# Patient Record
Sex: Female | Born: 1950 | Race: White | Hispanic: No | Smoking: Never smoker
Health system: Southern US, Community
[De-identification: ages and names within clinical notes are randomized; demographics above are authoritative.]

## PROBLEM LIST (undated history)

## (undated) DIAGNOSIS — E039 Hypothyroidism, unspecified: Secondary | ICD-10-CM

## (undated) DIAGNOSIS — Z889 Allergy status to unspecified drugs, medicaments and biological substances status: Secondary | ICD-10-CM

## (undated) DIAGNOSIS — F32A Depression, unspecified: Secondary | ICD-10-CM

## (undated) DIAGNOSIS — M1990 Unspecified osteoarthritis, unspecified site: Secondary | ICD-10-CM

## (undated) DIAGNOSIS — K648 Other hemorrhoids: Secondary | ICD-10-CM

## (undated) DIAGNOSIS — F329 Major depressive disorder, single episode, unspecified: Secondary | ICD-10-CM

## (undated) DIAGNOSIS — R112 Nausea with vomiting, unspecified: Secondary | ICD-10-CM

## (undated) DIAGNOSIS — IMO0001 Reserved for inherently not codable concepts without codable children: Secondary | ICD-10-CM

## (undated) DIAGNOSIS — R350 Frequency of micturition: Secondary | ICD-10-CM

## (undated) DIAGNOSIS — I10 Essential (primary) hypertension: Secondary | ICD-10-CM

## (undated) DIAGNOSIS — K219 Gastro-esophageal reflux disease without esophagitis: Secondary | ICD-10-CM

## (undated) DIAGNOSIS — Z9889 Other specified postprocedural states: Secondary | ICD-10-CM

## (undated) DIAGNOSIS — E785 Hyperlipidemia, unspecified: Secondary | ICD-10-CM

## (undated) HISTORY — PX: DILATION AND CURETTAGE OF UTERUS: SHX78

## (undated) HISTORY — DX: Major depressive disorder, single episode, unspecified: F32.9

## (undated) HISTORY — DX: Unspecified osteoarthritis, unspecified site: M19.90

## (undated) HISTORY — DX: Hyperlipidemia, unspecified: E78.5

## (undated) HISTORY — PX: OVARIAN CYST REMOVAL: SHX89

## (undated) HISTORY — PX: BLADDER SURGERY: SHX569

## (undated) HISTORY — DX: Other hemorrhoids: K64.8

## (undated) HISTORY — PX: PARTIAL HYSTERECTOMY: SHX80

## (undated) HISTORY — PX: TONSILLECTOMY: SUR1361

## (undated) HISTORY — PX: JOINT REPLACEMENT: SHX530

## (undated) HISTORY — DX: Allergy status to unspecified drugs, medicaments and biological substances: Z88.9

## (undated) HISTORY — DX: Hypothyroidism, unspecified: E03.9

## (undated) HISTORY — PX: OTHER SURGICAL HISTORY: SHX169

## (undated) HISTORY — DX: Depression, unspecified: F32.A

## (undated) HISTORY — PX: CATARACT EXTRACTION: SUR2

## (undated) HISTORY — DX: Gastro-esophageal reflux disease without esophagitis: K21.9

## (undated) HISTORY — PX: LIPOMA EXCISION: SHX5283

## (undated) HISTORY — PX: CARPAL TUNNEL RELEASE: SHX101

## (undated) HISTORY — DX: Reserved for inherently not codable concepts without codable children: IMO0001

---

## 2002-10-22 ENCOUNTER — Emergency Department (HOSPITAL_COMMUNITY): Admission: EM | Admit: 2002-10-22 | Discharge: 2002-10-22 | Payer: Self-pay | Admitting: *Deleted

## 2004-07-24 ENCOUNTER — Encounter: Admission: RE | Admit: 2004-07-24 | Discharge: 2004-07-24 | Payer: Self-pay | Admitting: Family Medicine

## 2006-02-13 ENCOUNTER — Ambulatory Visit (HOSPITAL_COMMUNITY): Admission: RE | Admit: 2006-02-13 | Discharge: 2006-02-13 | Payer: Self-pay | Admitting: Family Medicine

## 2006-02-18 ENCOUNTER — Ambulatory Visit (HOSPITAL_COMMUNITY): Admission: RE | Admit: 2006-02-18 | Discharge: 2006-02-18 | Payer: Self-pay | Admitting: Family Medicine

## 2007-09-17 ENCOUNTER — Ambulatory Visit (HOSPITAL_COMMUNITY): Admission: RE | Admit: 2007-09-17 | Discharge: 2007-09-17 | Payer: Self-pay | Admitting: Family Medicine

## 2007-10-08 ENCOUNTER — Encounter: Admission: RE | Admit: 2007-10-08 | Discharge: 2007-10-08 | Payer: Self-pay | Admitting: Family Medicine

## 2008-03-08 ENCOUNTER — Ambulatory Visit (HOSPITAL_COMMUNITY): Admission: RE | Admit: 2008-03-08 | Discharge: 2008-03-08 | Payer: Self-pay | Admitting: Family Medicine

## 2008-03-31 ENCOUNTER — Ambulatory Visit: Payer: Self-pay | Admitting: Gastroenterology

## 2008-04-06 ENCOUNTER — Ambulatory Visit: Payer: Self-pay | Admitting: Gastroenterology

## 2008-04-06 ENCOUNTER — Ambulatory Visit (HOSPITAL_COMMUNITY): Admission: RE | Admit: 2008-04-06 | Discharge: 2008-04-06 | Payer: Self-pay | Admitting: Gastroenterology

## 2008-04-06 ENCOUNTER — Encounter: Payer: Self-pay | Admitting: Gastroenterology

## 2008-04-06 HISTORY — PX: ESOPHAGOGASTRODUODENOSCOPY: SHX1529

## 2008-04-06 HISTORY — PX: OTHER SURGICAL HISTORY: SHX169

## 2008-05-24 ENCOUNTER — Ambulatory Visit: Payer: Self-pay | Admitting: Gastroenterology

## 2009-02-20 ENCOUNTER — Inpatient Hospital Stay (HOSPITAL_COMMUNITY): Admission: EM | Admit: 2009-02-20 | Discharge: 2009-02-22 | Payer: Self-pay | Admitting: Emergency Medicine

## 2009-05-30 ENCOUNTER — Ambulatory Visit (HOSPITAL_COMMUNITY): Admission: RE | Admit: 2009-05-30 | Discharge: 2009-05-30 | Payer: Self-pay | Admitting: Family Medicine

## 2009-09-07 ENCOUNTER — Encounter: Admission: RE | Admit: 2009-09-07 | Discharge: 2009-09-07 | Payer: Self-pay | Admitting: Family Medicine

## 2009-09-21 ENCOUNTER — Ambulatory Visit (HOSPITAL_COMMUNITY): Admission: RE | Admit: 2009-09-21 | Discharge: 2009-09-21 | Payer: Self-pay | Admitting: Family Medicine

## 2009-10-17 ENCOUNTER — Ambulatory Visit (HOSPITAL_COMMUNITY): Admission: RE | Admit: 2009-10-17 | Discharge: 2009-10-17 | Payer: Self-pay | Admitting: Family Medicine

## 2009-12-17 ENCOUNTER — Ambulatory Visit (HOSPITAL_COMMUNITY): Admission: RE | Admit: 2009-12-17 | Discharge: 2009-12-17 | Payer: Self-pay | Admitting: Family Medicine

## 2010-05-20 ENCOUNTER — Ambulatory Visit: Payer: Self-pay | Admitting: Internal Medicine

## 2010-05-20 ENCOUNTER — Inpatient Hospital Stay (HOSPITAL_COMMUNITY): Admission: EM | Admit: 2010-05-20 | Discharge: 2010-05-24 | Payer: Self-pay | Admitting: Emergency Medicine

## 2010-05-22 ENCOUNTER — Ambulatory Visit: Payer: Self-pay | Admitting: Internal Medicine

## 2010-06-03 ENCOUNTER — Encounter: Payer: Self-pay | Admitting: Internal Medicine

## 2010-08-22 ENCOUNTER — Ambulatory Visit: Payer: Self-pay | Admitting: Cardiology

## 2010-08-22 DIAGNOSIS — E039 Hypothyroidism, unspecified: Secondary | ICD-10-CM | POA: Insufficient documentation

## 2010-08-22 DIAGNOSIS — K5730 Diverticulosis of large intestine without perforation or abscess without bleeding: Secondary | ICD-10-CM | POA: Insufficient documentation

## 2010-08-22 DIAGNOSIS — I10 Essential (primary) hypertension: Secondary | ICD-10-CM | POA: Insufficient documentation

## 2010-08-22 DIAGNOSIS — E785 Hyperlipidemia, unspecified: Secondary | ICD-10-CM | POA: Insufficient documentation

## 2010-09-27 ENCOUNTER — Encounter: Admission: RE | Admit: 2010-09-27 | Discharge: 2010-09-27 | Payer: Self-pay | Admitting: Family Medicine

## 2010-10-01 ENCOUNTER — Encounter: Payer: Self-pay | Admitting: Cardiology

## 2010-10-09 ENCOUNTER — Encounter: Admission: RE | Admit: 2010-10-09 | Discharge: 2010-10-09 | Payer: Self-pay | Admitting: Family Medicine

## 2010-10-14 ENCOUNTER — Encounter: Payer: Self-pay | Admitting: Cardiology

## 2010-10-14 ENCOUNTER — Encounter (INDEPENDENT_AMBULATORY_CARE_PROVIDER_SITE_OTHER): Payer: Self-pay | Admitting: *Deleted

## 2010-10-14 LAB — CONVERTED CEMR LAB
ALT: 18 units/L
AST: 21 units/L
Albumin: 4.6 g/dL
Alkaline Phosphatase: 33 units/L
Total Protein: 6.5 g/dL

## 2010-10-16 LAB — CONVERTED CEMR LAB
ALT: 18 units/L (ref 0–35)
AST: 21 units/L (ref 0–37)
Alkaline Phosphatase: 33 units/L — ABNORMAL LOW (ref 39–117)
Cholesterol: 112 mg/dL (ref 0–200)
Indirect Bilirubin: 0.2 mg/dL (ref 0.0–0.9)
Total Protein: 6.5 g/dL (ref 6.0–8.3)
Triglycerides: 254 mg/dL — ABNORMAL HIGH (ref <150)
VLDL: 51 mg/dL — ABNORMAL HIGH (ref 0–40)

## 2010-11-07 ENCOUNTER — Encounter (INDEPENDENT_AMBULATORY_CARE_PROVIDER_SITE_OTHER): Payer: Self-pay | Admitting: *Deleted

## 2010-11-11 ENCOUNTER — Encounter (INDEPENDENT_AMBULATORY_CARE_PROVIDER_SITE_OTHER): Payer: Self-pay | Admitting: *Deleted

## 2010-11-11 ENCOUNTER — Ambulatory Visit: Payer: Self-pay | Admitting: Cardiology

## 2010-11-12 ENCOUNTER — Encounter (INDEPENDENT_AMBULATORY_CARE_PROVIDER_SITE_OTHER): Payer: Self-pay | Admitting: *Deleted

## 2010-11-19 ENCOUNTER — Encounter: Payer: Self-pay | Admitting: Cardiology

## 2010-11-20 ENCOUNTER — Encounter: Payer: Self-pay | Admitting: Adult Health

## 2011-01-08 ENCOUNTER — Ambulatory Visit (HOSPITAL_COMMUNITY): Admission: RE | Admit: 2011-01-08 | Payer: Self-pay | Source: Home / Self Care | Admitting: Cardiology

## 2011-01-13 ENCOUNTER — Ambulatory Visit: Admit: 2011-01-13 | Payer: Self-pay | Admitting: Cardiology

## 2011-01-19 ENCOUNTER — Encounter: Payer: Self-pay | Admitting: Family Medicine

## 2011-01-30 NOTE — Assessment & Plan Note (Signed)
Summary: 3 mth f/u /tg   Visit Type:  Follow-up Primary Kyona Chauncey:  [name]   History of Present Illness: [name] is a very nice 60 y/o CF with known history of IDDM (now on a pump and a sensor subq), hypertension, hypertriglyceridemia.  She is here on follow-up for triglyceridemia.  She was placed on fenofibrate by Dr. Daleen Squibb on last visit, a follow-up lipid study was completed.  She has since been seen by Dr. Rocky Crafts who is an endocrinologist.  He has adjusted her insulin pump and added Crestor to her medication regimine along with fenobriate previously started in August of 2011.  Follow-up labs done on the 11/05/2010 per Dr. Rocky Crafts reveals triglycerides of 135, Cholesterol of 100 and normal LFT's.  She wishes one physician to follow her cholesterol and manage it.  She has multiple CVRF but is asymptomatic.  She says that once her blood glucose was under control she feels "20 years younger."  Current Medications (verified): 1)  Metformin Hcl 1000 Mg Tabs (Metformin Hcl) .... Take 1 Tab Two Times A Day 2)  Fenofibrate 160 Mg Tabs (Fenofibrate) .... Take 1 Tablet By Mouth At Bedtime 3)  Fish Oil 1200 Mg Caps (Omega-3 Fatty Acids) .... Take 2 Caps Two Times A Day 4)  Daily Multi  Tabs (Multiple Vitamins-Minerals) .... Take 1 Tab Daily 5)  Verapamil Hcl 120 Mg Tabs (Verapamil Hcl) .... Take 1 Tab Two Times A Day 6)  Lisinopril 40 Mg Tabs (Lisinopril) .... Take 1 Tab Daily 7)  Levothroid 50 Mcg Tabs (Levothyroxine Sodium) .... Take 1 Tab Daily 8)  Vitamin C Cr 500 Mg Cr-Caps (Ascorbic Acid) .... Take 1 Tab Daily 9)  Vitamin D 400 Unit Caps (Cholecalciferol) .... Take 1 Tab Daily 10)  Co Q 10 60 Mg Caps (Coenzyme Q10) .... Take 1 Tab Daily 11)  B Complex Vitamins  Caps (B Complex Vitamins) .... Take 1 Tab Daily 12)  Flax Seed Oil 1000 Mg Caps (Flaxseed (Linseed)) .... Take 1 Tab Two Times A Day 13)  Mag-Ox 400 400 Mg Tabs (Magnesium Oxide) .... Take 1 Tab Daily 14)   Hydrocodone-Acetaminophen 2.5-500 Mg Tabs (Hydrocodone-Acetaminophen) .... Take As Needed 15)  Citalopram Hydrobromide 40 Mg Tabs (Citalopram Hydrobromide) .... Take 1 Tab Daily  Allergies: 1)  ! * Demorol 2)  ! Penicillin  Comments:  Nurse/Medical Assistant: patient stated she takes 2 caps of fish oil two times a day and1 flaxseed two times a day and citlatapram 1 tab daily   Past History:  Past medical, surgical, family and social histories (including risk factors) reviewed, and no changes noted (except as noted below).  Past Medical History: Reviewed history from 08/20/2010 and no changes required. acute diverticulitis allergic reaction to augmentin osteoarthritis gerds hyperlipidemia depression hypothyroidism ulcer at the gastroesophageal junction  Past Surgical History: Reviewed history from 08/20/2010 and no changes required. hysterectomy (partial) bladder tack surgery bilateral carpal tunnel release bilateral arm surgery removal of benign lipomas from the abdoman post tonsillectomy goiter removals  Family History: Reviewed history from 08/22/2010 and no changes required. Family History of Coronary Artery Disease:  Family History of Diabetes:  Family History of Hypertension:   Social History: Reviewed history from 08/22/2010 and no changes required.  Widowed  Retired  Tobacco Use - No.  Alcohol Use - no  Review of Systems       All other systems have been reviewed and are negative unless stated above.   Vital Signs:  Patient profile:  60 year old female Weight:      106 pounds O2 Sat:      95 % on Room air Pulse rate:   95 / minute BP sitting:   122 / 75  (right arm)  Vitals Entered By: Dreama Saa, CNA (November 11, 2010 10:48 AM)  O2 Flow:  Room air  Physical Exam  General:  Well developed, well nourished, in no acute distress.healthy appearing.   Head:  normocephalic and atraumatic Eyes:  Glasses Lungs:  Clear bilaterally to  auscultation and percussion. Heart:  Non-displaced PMI, chest non-tender; regular rate and rhythm, S1, S2 without murmurs, rubs or gallops. Carotid upstroke normal, no bruit. Normal abdominal aortic size, no bruits. Femorals normal pulses, no bruits. Pedals normal pulses. No edema, no varicosities. Abdomen:  Bowel sounds positive; abdomen soft and non-tender without masses, organomegaly, or hernias noted. No hepatosplenomegaly. She has an insulin pump on the Right and blood glucose sensor on the left abdomen. Msk:  Back normal, normal gait. Muscle strength and tone normal. Pulses:  pulses normal in all 4 extremities Extremities:  No clubbing or cyanosis. Neurologic:  Alert and oriented x 3. Psych:  Normal affect.   Impression & Recommendations:  Problem # 1:  HYPERLIPIDEMIA (ICD-272.4) She has had a vast improvement in her triglyceride status with fenofibrate.  She is recently started on crestor 10mg  per endocrinologist.  He is following her closely and she wishes that he continue to manage this diagnosis.  We will make no changes at this time and defer to Dr. Rocky Crafts as her request. Her updated medication list for this problem includes:    Fenofibrate 160 Mg Tabs (Fenofibrate) .Marland Kitchen... Take 1 tablet by mouth at bedtime  Problem # 2:  CORONARY ARTERY DISEASE, FAMILY HX (ICD-V17.3) She has multiple CVRF for CAD-Hypertension, DM,Mixed hyperlipidemia, and strong family history.  I will plan a stress echo for her to evaluate for underlying ischemia.  She is asymptomatic at present and wishes to wait until January 2012 to proceed with this as she is in the [place] a few weeks.  Will plan for stress echo and follow-up in January.  If she becomes symptomatic will see her sooner. Orders: Stress Echo (Stress Echo)  Problem # 3:  HYPERTENSION, MILD (ICD-401.1) Assessment: Unchanged Well controlled. Her updated medication list for this problem includes:     Verapamil Hcl 120 Mg Tabs (Verapamil hcl) .Marland Kitchen... Take 1 tab two times a day    Lisinopril 40 Mg Tabs (Lisinopril) .Marland Kitchen... Take 1 tab daily  Orders: Stress Echo (Stress Echo)  Patient Instructions: 1)  Your physician recommends that you schedule a follow-up appointment in: after testing 2)  Your physician has requested that you have a stress echocardiogram. For further information please visit [email or web address]  Please follow instruction sheet as given.

## 2011-01-30 NOTE — Letter (Signed)
Summary: LABS 07/17/10  LABS 07/17/10   Imported By: Faythe Ghee 08/22/2010 11:49:39  _____________________________________________________________________  External Attachment:    Type:   Image     Comment:   External Document

## 2011-01-30 NOTE — Letter (Signed)
Summary: [facility] PROGRESS NOTE 07/17/10  [facility]GRESS NOTE 07/17/10   Imported By: Faythe Ghee 08/22/2010 11:50:04  _____________________________________________________________________  External Attachment:    Type:   Image     Comment:   External Document

## 2011-01-30 NOTE — Letter (Signed)
Summary: labs 09/27/10  labs 09/27/10   Imported By: Faythe Ghee 10/01/2010 15:44:16  _____________________________________________________________________  External Attachment:    Type:   Image     Comment:   External Document

## 2011-01-30 NOTE — Assessment & Plan Note (Signed)
Summary: **per Dr.Golding for elevated Lipids/pt. requests Dr.Wall/tg   Visit Type:  Initial Consult Primary [name]:  [name]  CC:  no cardiology complaints.  History of Present Illness: [name] is a 60 y/o CF we are seeing at the kind request of Dr. Phillips Odor. She has a history of IDDM (on a pump for 2 months), Hypertension, Hypertriglyceridemia, Hypothyroidism and Diverticulitis.  We are asked for recommendations on mixed hyperlipidemia as her cholesterol and triglycerides are elevated (Triglyercides 948).  She had been intolerant to niacin and higher doses of statins.  She has been seen by cardiology in early 2000 in [place] which included a stress test and echo-pt reports this was normal and she did not follow-up with cardiology after that.  She is asymtpomtic.  Preventive Screening-Counseling & Management  Alcohol-Tobacco     Alcohol drinks/day: 0     Smoking Status: never  Current Medications (verified): 1)  Metformin Hcl 1000 Mg Tabs (Metformin Hcl) .... Take 1 Tab Two Times A Day 2)  Fenofibrate 160 Mg Tabs (Fenofibrate) .... Take 1 Tablet By Mouth At Bedtime 3)  Fish Oil 1200 Mg Caps (Omega-3 Fatty Acids) .... Take 1 Cap Daily 4)  Daily Multi  Tabs (Multiple Vitamins-Minerals) .... Take 1 Tab Daily 5)  Verapamil Hcl 120 Mg Tabs (Verapamil Hcl) .... Take 1 Tab Two Times A Day 6)  Lisinopril 40 Mg Tabs (Lisinopril) .... Take 1 Tab Daily 7)  Levothroid 50 Mcg Tabs (Levothyroxine Sodium) .... Take 1 Tab Daily 8)  Vitamin C Cr 500 Mg Cr-Caps (Ascorbic Acid) .... Take 1 Tab Daily 9)  Vitamin D 400 Unit Caps (Cholecalciferol) .... Take 1 Tab Daily 10)  Co Q 10 60 Mg Caps (Coenzyme Q10) .... Take 1 Tab Daily 11)  B Complex Vitamins  Caps (B Complex Vitamins) .... Take 1 Tab Daily 12)  Flax Seed Oil 1000 Mg Caps (Flaxseed (Linseed)) .... Take 1 Tab Daily 13)  Mag-Ox 400 400 Mg Tabs (Magnesium Oxide) .... Take 1 Tab Daily 14)  Hydrocodone-Acetaminophen 2.5-500 Mg Tabs  (Hydrocodone-Acetaminophen) .... Take As Needed 15)  Citalopram Hydrobromide 40 Mg Tabs (Citalopram Hydrobromide) .... Take 1/2 Tab Daily  Allergies (verified): 1)  ! * Demorol 2)  ! Penicillin  Family History: Family History of Coronary Artery Disease:  Family History of Diabetes:  Family History of Hypertension:   Social History:  Widowed  Retired  Tobacco Use - No.  Alcohol Use - no Alcohol drinks/day:  0 Smoking Status:  never  Review of Systems       All other systems have been reviewed and are negative unless stated above.   Vital Signs:  Patient profile:   60 year old female Height:      58 inches Weight:      103 pounds BMI:     21.60 Pulse rate:   81 / minute BP sitting:   128 / 79  (right arm)  Vitals Entered By: Dreama Saa, CNA (August 22, 2010 9:50 AM)  Physical Exam  General:  Well developed, well nourished, in no acute distress. Head:  normocephalic and atraumatic Eyes:  Wears glasses Lungs:  Clear bilaterally to auscultation and percussion. Heart:  Non-displaced PMI, chest non-tender; regular rate and rhythm, S1, S2 without murmurs, rubs or gallops. Carotid upstroke normal, no bruit. Normal abdominal aortic size, no bruits. Femorals normal pulses, no bruits. Pedals normal pulses. No edema, no varicosities. Abdomen:  Bowel sounds positive; abdomen soft and non-tender without masses, organomegaly, or hernias  noted. No hepatosplenomegaly. Msk:  Back normal, normal gait. Muscle strength and tone normal. Pulses:  pulses normal in all 4 extremities Extremities:  trace left pedal edema.  trace left pedal edema.   Neurologic:  Alert and oriented x 3. Skin:  Intact without lesions or rashes. Psych:  Normal affect.   Impression & Recommendations:  Problem # 1:  HYPERLIPIDEMIA (ICD-272.4) After careful review of medications and discussion with patient, it has been advised by Dr. Daleen Squibb that the patient stop Simvistatin in the setting of Verapamil use.   Also hypertriglyceridemia is severely elevated >900.  Will begin fenofibrate 160mg  daily.  Will control hypertriglyeridemia first and try to get it below 500. At that point will add back statin, probably Pravachol as it is better tolerated.  Will check fasting lipids in 6-8 weeks. There is a drug-drug interaction with use of statins and fenofibrates.  Pravastatin would be the best choice at this time.  Will see her back in 3 months to evaluate status. The following medications were removed from the medication list:    Niacin Cr 500 Mg Cr-caps (Niacin) .Marland Kitchen... Take 1 tab daily Her updated medication list for this problem includes:    Fenofibrate 160 Mg Tabs (Fenofibrate) .Marland Kitchen... Take 1 tablet by mouth at bedtime  Future Orders: T-Lipid Profile ([id number]) ... 10/14/2010 T-Hepatic Function [phone]) ... 10/14/2010  Problem # 2:  CORONARY ARTERY DISEASE, FAMILY HX (ICD-V17.3) She has significant risk factors for CAD.  Once mixed hyperlipidemia is better controlled would plan cardiac work-up with stress test.  Patient Instructions: 1)  Your physician recommends that you schedule a follow-up appointment in: 3 2)  Your physician recommends that you return for lab work in: 6 to 8 weeks 3)  Your physician has recommended you make the following change in your medication: stop taking Simvastatin and start taking Fenofibrate 160mg  by mouth once daily  Prescriptions: FENOFIBRATE 160 MG TABS (FENOFIBRATE) take 1 tablet by mouth at bedtime  #30 x 3   Entered by:   Larita Fife Via LPN   Authorized by:   Gaylord Shih, MD, [facility]   Signed by:   Larita Fife Via LPN on 53/66/4403   Method used:   Electronically to        [facility]  [address] (retail)       [address]       [place], [place]  [address]       Ph: [phone]       Fax: [phone]   RxID:   [id number]

## 2011-01-30 NOTE — Miscellaneous (Signed)
  Clinical Lists Changes  Medications: Added new medication of CRESTOR 10 MG TABS (ROSUVASTATIN CALCIUM) Take one tablet by mouth daily. 

## 2011-01-30 NOTE — Miscellaneous (Signed)
Summary: labs lipids,liver,10/14/2010  Clinical Lists Changes  Observations: Added new observation of ALBUMIN: 4.6 g/dL (16/09/9603 54:09) Added new observation of PROTEIN, TOT: 6.5 g/dL (81/19/1478 29:56) Added new observation of SGPT (ALT): 18 units/L (10/14/2010 10:59) Added new observation of SGOT (AST): 21 units/L (10/14/2010 10:59) Added new observation of ALK PHOS: 33 units/L (10/14/2010 10:59) Added new observation of BILI DIRECT: 0.1 mg/dL (21/30/8657 84:69) Added new observation of LDL: 28 mg/dL (62/95/2841 32:44) Added new observation of HDL: 33 mg/dL (12/31/7251 66:44) Added new observation of TRIGLYC TOT: 254 mg/dL (03/47/4259 56:38) Added new observation of CHOLESTEROL: 112 mg/dL (75/64/3329 51:88)

## 2011-01-30 NOTE — Letter (Signed)
Summary: Stress Echocardiogram Information Sheet  [facility]  [address] [place] [address]   Phone: [phone]  Fax: [phone]      November 11, 2010 MRN: [id number] light prior to the test.   [name]  Doctor: Appointment Date: Appointment Time: Appointment Location: [facility]  Stress Echocardiogram Information Sheet    Instructions:   1. DO NOT  take your METFORMIN,VERAPAMIL,LISINOPRI medicine THE MORNING OF THE TEST.  2. NOTHING TO EAT OR DRINK AFTER MIDNIGHT  3. Dress prepared to exercise.  4. DO NOT use ANY caffine or tobacco products 3 hours before appointment.  5. Report to the Short Stay Center on the1st floor.  6. Please bring all current prescription medications.  7. If you have any questions, please call [phone]

## 2011-01-30 NOTE — Letter (Signed)
Summary: DR Leslie Dales OFFICE RECORDS  [facility] OFFICE RECORDS   Imported By: Faythe Ghee 11/19/2010 10:29:53  _____________________________________________________________________  External Attachment:    Type:   Image     Comment:   External Document

## 2011-01-30 NOTE — Letter (Signed)
Summary: LABS  LABS   Imported By: Faythe Ghee 11/20/2010 13:04:54  _____________________________________________________________________  External Attachment:    Type:   Image     Comment:   External Document

## 2011-01-30 NOTE — Letter (Signed)
Summary: [facility]  [address] [place] [address]   Phone: [phone]  Fax: [phone]     August 22, 2010 MRN: [id number]   [facility] [address] [place], [place]  [address]      YOUR LAB WORK IS DUE  October 14, 2010 _________________________________________  Please go to [facility], located across the street from [facility] on the second floor.  Hours are Monday - Friday 7am until 7:30pm         Saturday 8am until 12noon    _X_  DO NOT EAT OR DRINK AFTER MIDNIGHT EVENING PRIOR TO LABWORK  __ YOUR LABWORK IS NOT FASTING --YOU MAY EAT PRIOR TO LABWORK

## 2011-01-30 NOTE — Consult Note (Signed)
Summary: Consultation Report  Consultation Report   Imported By: Peggyann Shoals 06/03/2010 10:32:03  _____________________________________________________________________  External Attachment:    Type:   Image     Comment:   External Document

## 2011-01-31 NOTE — Letter (Signed)
Summary: office not from dr altheimer 09/27/10  office not from dr altheimer 09/27/10   Imported By: Faythe Ghee 10/01/2010 15:43:55  _____________________________________________________________________  External Attachment:    Type:   Image     Comment:   External Document

## 2011-03-17 LAB — GLUCOSE, CAPILLARY
Glucose-Capillary: 149 mg/dL — ABNORMAL HIGH (ref 70–99)
Glucose-Capillary: 162 mg/dL — ABNORMAL HIGH (ref 70–99)
Glucose-Capillary: 177 mg/dL — ABNORMAL HIGH (ref 70–99)
Glucose-Capillary: 195 mg/dL — ABNORMAL HIGH (ref 70–99)
Glucose-Capillary: 233 mg/dL — ABNORMAL HIGH (ref 70–99)
Glucose-Capillary: 283 mg/dL — ABNORMAL HIGH (ref 70–99)
Glucose-Capillary: 419 mg/dL — ABNORMAL HIGH (ref 70–99)

## 2011-03-17 LAB — DIFFERENTIAL
Basophils Absolute: 0.1 10*3/uL (ref 0.0–0.1)
Basophils Absolute: 0.1 10*3/uL (ref 0.0–0.1)
Basophils Relative: 0 % (ref 0–1)
Basophils Relative: 0 % (ref 0–1)
Basophils Relative: 1 % (ref 0–1)
Eosinophils Absolute: 0.2 10*3/uL (ref 0.0–0.7)
Eosinophils Relative: 0 % (ref 0–5)
Eosinophils Relative: 1 % (ref 0–5)
Eosinophils Relative: 3 % (ref 0–5)
Lymphocytes Relative: 13 % (ref 12–46)
Lymphocytes Relative: 22 % (ref 12–46)
Lymphs Abs: 2.1 10*3/uL (ref 0.7–4.0)
Monocytes Absolute: 1 10*3/uL (ref 0.1–1.0)
Neutro Abs: 6.1 10*3/uL (ref 1.7–7.7)
Neutrophils Relative %: 66 % (ref 43–77)

## 2011-03-17 LAB — BASIC METABOLIC PANEL
BUN: 16 mg/dL (ref 6–23)
BUN: 8 mg/dL (ref 6–23)
BUN: 9 mg/dL (ref 6–23)
CO2: 27 mEq/L (ref 19–32)
Calcium: 8.4 mg/dL (ref 8.4–10.5)
Calcium: 9.8 mg/dL (ref 8.4–10.5)
Creatinine, Ser: 0.86 mg/dL (ref 0.4–1.2)
Creatinine, Ser: 0.94 mg/dL (ref 0.4–1.2)
GFR calc Af Amer: >60 mL/min (ref >60)
GFR calc non Af Amer: >60 mL/min (ref >60)
GFR calc non Af Amer: >60 mL/min (ref >60)
GFR calc non Af Amer: >60 mL/min (ref >60)
Glucose, Bld: 136 mg/dL — ABNORMAL HIGH (ref 70–99)
Glucose, Bld: 183 mg/dL — ABNORMAL HIGH (ref 70–99)
Potassium: 4.1 mEq/L (ref 3.5–5.1)

## 2011-03-17 LAB — CULTURE, BLOOD (ROUTINE X 2)
Culture: NO GROWTH
Culture: NO GROWTH

## 2011-03-17 LAB — CBC
HCT: 34.4 % — ABNORMAL LOW (ref 36.0–46.0)
HCT: 36.8 % (ref 36.0–46.0)
Hemoglobin: 11.8 g/dL — ABNORMAL LOW (ref 12.0–15.0)
MCHC: 34.2 g/dL (ref 30.0–36.0)
MCHC: 35.5 g/dL (ref 30.0–36.0)
MCV: 86.4 fL (ref 78.0–100.0)
Platelets: 194 10*3/uL (ref 150–400)
Platelets: 224 10*3/uL (ref 150–400)
RBC: 3.66 MIL/uL — ABNORMAL LOW (ref 3.87–5.11)
RBC: 4.02 MIL/uL (ref 3.87–5.11)
RDW: 12.9 % (ref 11.5–15.5)
RDW: 13.2 % (ref 11.5–15.5)
WBC: 9.3 10*3/uL (ref 4.0–10.5)
WBC: 9.9 10*3/uL (ref 4.0–10.5)

## 2011-03-17 LAB — URINALYSIS, ROUTINE W REFLEX MICROSCOPIC
Bilirubin Urine: NEGATIVE
Ketones, ur: NEGATIVE mg/dL
Nitrite: NEGATIVE
Urobilinogen, UA: 0.2 mg/dL (ref 0.0–1.0)

## 2011-03-17 LAB — HEPATIC FUNCTION PANEL
ALT: 26 U/L (ref 0–35)
Bilirubin, Direct: 0.1 mg/dL (ref 0.0–0.3)
Indirect Bilirubin: 0.4 mg/dL (ref 0.3–0.9)
Total Protein: 6.7 g/dL (ref 6.0–8.3)

## 2011-03-17 LAB — LIPASE, BLOOD: Lipase: 27 U/L (ref 11–59)

## 2011-04-15 LAB — CBC
HCT: 38.1 % (ref 36.0–46.0)
MCHC: 34.7 g/dL (ref 30.0–36.0)
MCHC: 34.9 g/dL (ref 30.0–36.0)
MCV: 87.6 fL (ref 78.0–100.0)
Platelets: 267 10*3/uL (ref 150–400)
RBC: 4.35 MIL/uL (ref 3.87–5.11)
RDW: 13.1 % (ref 11.5–15.5)
WBC: 12.1 10*3/uL — ABNORMAL HIGH (ref 4.0–10.5)

## 2011-04-15 LAB — DIFFERENTIAL
Basophils Absolute: 0 10*3/uL (ref 0.0–0.1)
Basophils Relative: 0 % (ref 0–1)
Basophils Relative: 1 % (ref 0–1)
Eosinophils Absolute: 0.1 10*3/uL (ref 0.0–0.7)
Eosinophils Relative: 1 % (ref 0–5)
Lymphocytes Relative: 17 % (ref 12–46)
Lymphs Abs: 2.2 10*3/uL (ref 0.7–4.0)
Monocytes Absolute: 1 10*3/uL (ref 0.1–1.0)
Monocytes Relative: 8 % (ref 3–12)
Neutro Abs: 8.9 10*3/uL — ABNORMAL HIGH (ref 1.7–7.7)
Neutrophils Relative %: 72 % (ref 43–77)
Neutrophils Relative %: 74 % (ref 43–77)

## 2011-04-15 LAB — GLUCOSE, CAPILLARY
Glucose-Capillary: 167 mg/dL — ABNORMAL HIGH (ref 70–99)
Glucose-Capillary: 174 mg/dL — ABNORMAL HIGH (ref 70–99)
Glucose-Capillary: 185 mg/dL — ABNORMAL HIGH (ref 70–99)
Glucose-Capillary: 187 mg/dL — ABNORMAL HIGH (ref 70–99)
Glucose-Capillary: 194 mg/dL — ABNORMAL HIGH (ref 70–99)
Glucose-Capillary: 200 mg/dL — ABNORMAL HIGH (ref 70–99)
Glucose-Capillary: 201 mg/dL — ABNORMAL HIGH (ref 70–99)
Glucose-Capillary: 207 mg/dL — ABNORMAL HIGH (ref 70–99)
Glucose-Capillary: 211 mg/dL — ABNORMAL HIGH (ref 70–99)
Glucose-Capillary: 314 mg/dL — ABNORMAL HIGH (ref 70–99)

## 2011-04-15 LAB — BASIC METABOLIC PANEL
BUN: 15 mg/dL (ref 6–23)
BUN: 18 mg/dL (ref 6–23)
BUN: 25 mg/dL — ABNORMAL HIGH (ref 6–23)
CO2: 24 mEq/L (ref 19–32)
CO2: 25 mEq/L (ref 19–32)
CO2: 27 mEq/L (ref 19–32)
Calcium: 8.4 mg/dL (ref 8.4–10.5)
Calcium: 8.4 mg/dL (ref 8.4–10.5)
Calcium: 8.7 mg/dL (ref 8.4–10.5)
Calcium: 9.8 mg/dL (ref 8.4–10.5)
Chloride: 84 mEq/L — ABNORMAL LOW (ref 96–112)
Chloride: 92 mEq/L — ABNORMAL LOW (ref 96–112)
Chloride: 96 mEq/L (ref 96–112)
Creatinine, Ser: 0.9 mg/dL (ref 0.4–1.2)
Creatinine, Ser: 1 mg/dL (ref 0.4–1.2)
Creatinine, Ser: 1.07 mg/dL (ref 0.4–1.2)
Creatinine, Ser: 1.13 mg/dL (ref 0.4–1.2)
GFR calc Af Amer: 56 mL/min — ABNORMAL LOW (ref >60)
GFR calc Af Amer: 60 mL/min — ABNORMAL LOW (ref >60)
GFR calc Af Amer: >60 mL/min (ref >60)
GFR calc Af Amer: >60 mL/min (ref >60)
GFR calc non Af Amer: 52 mL/min — ABNORMAL LOW (ref >60)
Glucose, Bld: 113 mg/dL — ABNORMAL HIGH (ref 70–99)
Potassium: 4.7 mEq/L (ref 3.5–5.1)
Sodium: 123 mEq/L — ABNORMAL LOW (ref 135–145)
Sodium: 128 mEq/L — ABNORMAL LOW (ref 135–145)

## 2011-04-15 LAB — URINALYSIS, ROUTINE W REFLEX MICROSCOPIC
Nitrite: NEGATIVE
Specific Gravity, Urine: 1.01 (ref 1.005–1.030)
Urobilinogen, UA: 0.2 mg/dL (ref 0.0–1.0)
pH: 6.5 (ref 5.0–8.0)

## 2011-04-15 LAB — PROTIME-INR: INR: 1 (ref 0.00–1.49)

## 2011-05-13 NOTE — Consult Note (Signed)
NAMECALEA, HRIBAR            ACCOUNT NO.:  [id number]   MEDICAL RECORD NO.:  [id number]          PATIENT TYPE:  AMB   LOCATION:  DAY                           FACILITY:  [facility]   PHYSICIAN:  Kassie Mends, M.D.      DATE OF BIRTH:  [birth year]   DATE OF CONSULTATION:  03/31/2008  DATE OF DISCHARGE:                                 CONSULTATION   REQUESTING PHYSICIAN:  Corrie Mckusick, M.D.   REASON FOR CONSULTATION:  Diarrhea, consult for colonoscopy.   HISTORY OF PRESENT ILLNESS:  Ms. Stangelo is a pleasant 60 year old  Caucasian female who presents today for further evaluation of a 6-week  history of vomiting and diarrhea.  She says that she is not vomiting.  She is having diarrhea.  This is occurring daily.  She is having 4-5  soft to loose stools daily.  She has had nocturnal stools.  She feels  like she has a knife in her abdomen.  She complains of a gripey type  pain.  She feels like she is on fire.  She has heartburn when this  occurs.  She denies any dysphagia, odynophagia, melena, or rectal  bleeding, fever or chills.  She was given 2 antibiotics fairly close  together over the past couple of months, one was a Z-Pak and one was  Avelox.  Currently, she is on Lomotil which she is taking 2-4 tablets a  day.  On Lomotil she still has 2-3 loose stools.  She says she did stool  studies which were all negative.  We are trying to get these records.  She says she does feel afraid to eat because of the vomiting and the  diarrhea.  She denies any dysphagia or odynophagia.   CURRENT MEDICATIONS:  1. NovoLog 70/30, 30 units b.i.d.  2. Lantus 60 units every evening.  3. Metformin 1 gram b.i.d.  4. Januvia 100 mg daily.  5. Verapamil 240 mg daily.  6. Lisinopril 40 mg daily.  7. Celexa 10 mg daily.  8. Multivitamin daily.  9. Vitamin C 500 mg daily.  10.Levothyroxine 50 mcg daily.  11.Aspirin 81 mg daily.  12.Lomotil 2-4 daily.  13.Promethazine 25 mg p.r.n.   ALLERGIES:   DEMEROL causes projectile vomiting.   PAST MEDICAL HISTORY:  1. Diabetes mellitus, diagnosed in 1997.  2. Hypertension.  3. Hypercholesterolemia.  4. Depression.  5. Hypothyroidism, status post removal of a goiter when she was a      teenager.   PAST SURGICAL HISTORY:  1. Tonsillectomy.  2. Surgery on goiter.  3. Two lipomas removed from her stomach.  4. 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  5. Bilateral carpal tunnel release.  6. Hysterectomy.  7. Bladder tacking.   FAMILY HISTORY:  Negative for colorectal cancer.   SOCIAL HISTORY:  She is widowed.  Her husband died in 2023-12-15, heart-  related illness.  She does not have any children.  She is employed as a  Engineer, mining for [employer].  Never been a smoker.  No alcohol use.   REVIEW OF SYSTEMS:  See HPI for  GI.  See HPI for constitutional.  CARDIOPULMONARY:  She denies chest pain, shortness of breath,  palpitations, cough.  GENITOURINARY:  Denies dysuria, hematuria.   PHYSICAL EXAMINATION:  VITAL SIGNS:  Weight 110, height 4 feet 10  inches, temp 99.1, blood pressure 142/72, pulse 80.  GENERAL:  A pleasant, well nourished, well developed Caucasian female in  no acute distress.  SKIN:  Warm and dry.  No jaundice.  HEENT:  Sclerae are nonicteric.  Oropharyngeal mucosa is moist and pink.  No lesions, erythema or exudate.  No lymphadenopathy, thyromegaly.  CHEST:  Lungs are clear to auscultation.  CARDIAC:  Reveals a regular rate and rhythm.  Normal S1, S2.  No  murmurs, rubs, or gallops.  ABDOMEN:  Positive bowel sounds.  Abdomen is soft.  She has moderate  epigastric tenderness to deep palpation.  No rebound or guarding.  No  abdominal bruits or hernias.  No hepatosplenomegaly or masses.  LOWER EXTREMITIES:  No edema.   IMPRESSION:  Ms. Sandberg is a 60 year old lady who presents with a 6-  week history of vomiting and diarrhea and a 5-pound weight loss.  She  has anorexia, fear of eating due to these  symptoms.  She complains of  nocturnal stools.  She has gastroesophageal reflux disease.  Not  mentioned above, she was given an empiric course of Flagyl and did not  improve.  Cannot exclude the possibility of Clostridium difficile  colitis but I feel this is less likely at this point.  Her vomiting may  be unrelated and possibly due to diabetic gastroparesis, although  gastroesophageal reflux disease remained in the differential as well.  She has never had a colonoscopy or esophagogastroduodenoscopy.   PLAN:  EGD or colonoscopy in the near future with Dr. Cira Servant.  I  discussed risks, alternatives, benefits with the patient and she is  agreeable to proceed.  She states that she will not take GoLYTELY prep  as she has had this before and does not tolerate even 2 glasses without  vomiting.  We will give her MiraLax prep with Dulcolax tablets.  Written instructions provided regarding her diabetic medications during  the prep.  We will await records on stool studies.   I would like to thank Dr. Assunta Found for allowing [place] to take part in  the care of this patient.      Tana Coast, P.A.      Kassie Mends, M.D.  Electronically Signed    LL/MEDQ  D:  03/31/2008  T:  03/31/2008  Job:  [id number]   cc:   Corrie Mckusick, M.D.  Fax: [phone]

## 2011-05-13 NOTE — Assessment & Plan Note (Signed)
NAMEMarland Kitchen  TAI, SYFERT             CHART#:  [id number]   DATE:  05/24/2008                       DOB:  Aug 31, 1951   REFERRING PHYSICIAN:  Dr. Assunta Found.   PROBLEM LIST:  1. Heartburn secondary to gastroesophageal reflux disease.  2. Intermittent diarrhea likely secondary to lactose intolerance.  3. History of mild erosive esophagitis on EGD in April 2009.  4. A 3 to 4 cm hiatal hernia.   SUBJECTIVE:  Ms. Buschman is a 60 year old female who presents as a  return patient visit.  She has no particular questions, concerns, or  complaints.  She eliminated dairy from her diet and this helped  tremendously.  Fiber also has helped.  She says Nexium is wonderful.  She has been eating plenty of high fiber foods.  She now only  experiences diarrhea maybe once a week and thinks it might be secondary  to some dietary indiscretion.  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   MEDICATIONS:  1. NovoLog.  2. Lantus.  3. Metformin.  4. Januvia.  5. Verapamil.  6. Lisinopril.  7. Citalopram.  8. Multivitamin.  9. Vitamin C.  10.Levothyroxine.  11.Aspirin 81 mg daily.  12.Promethazine 25 mg as needed.  13.Nexium 40 mg daily.   OBJECTIVE:   PHYSICAL EXAM:  Weight 114 pounds, height 4 feet 10 inches, BMI 23.8  (healthy).  Temperature 98.3, blood pressure 128/70, pulse 84.  NEURO:  She is in no apparent distress.  Alert and oriented x4.  LUNGS:  Clear to auscultation bilaterally. CARDIOVASCULAR:  Regular rhythm.  ABDOMEN:  Bowel sounds are present.  Soft and nontender.  Nondistended.   ASSESSMENT:  Ms. Ellerson is a 60 year old female who has responded  nicely to Nexium.  Her diarrhea is most likely secondary to lactose  intolerance, and it has responded to avoidance of dairy products.  Thank  you for allowing me to see Mrs. Romeo Apple in consultation.  My  recommendations are to follow.   RECOMMENDATIONS:  1. Screening colonoscopy in 10 years.  2. She is given a  high fiber diet and asked to continue the high fiber      diet, making sure that she gets 25 to 35 g a day.  If she is able      to do this, she will not require any fiber supplementation.  3. She may continue Nexium.  She can call in for refills.  4. Outpatient visit in 6 months.       Kassie Mends, M.D.  Electronically Signed     SM/MEDQ  D:  05/24/2008  T:  05/24/2008  Job:  [id number]   cc:   Corrie Mckusick, M.D.

## 2011-05-13 NOTE — Discharge Summary (Signed)
NAMERUQAYA, STRAUSS            ACCOUNT NO.:  [id number]   MEDICAL RECORD NO.:  [id number]          PATIENT TYPE:  INP   LOCATION:  A338                          FACILITY:  [facility]   PHYSICIAN:  Osvaldo Shipper, MD     DATE OF BIRTH:  Oct 14, 1951   DATE OF ADMISSION:  02/19/2009  DATE OF DISCHARGE:  02/25/2010LH                               DISCHARGE SUMMARY   PRIMARY MEDICAL DOCTOR:  Corrie Mckusick, M.D.   DISCHARGE DIAGNOSES:  1. Hypoglycemia unclear etiology, resolved.  2. Hyponatremia, improved.  3. Insulin-dependent diabetes.  4. Hypertension.  5. Nausea, likely secondary to gastroparesis.   BRIEF HOSPITAL COURSE:  Briefly this is a 60 year old Caucasian female  who has a past medical history of insulin-dependent diabetes,  hypertension who presented to the hospital with complaints of low blood  sugars.  She did not report any changes in her medication regimen.  All  the medication that she takes for her diabetes, she has been taking it  for the past many months to years.  The patient is noticed to be on  metformin, Januvia, Lantus as well as insulin 70/30.  She does not  report any loss of weight, no infections except for, I believe,  sinusitis a few days ago.  So it is unclear what caused her  hypoglycemia.  It appears that her insulin requirements have come down.  We did start her back on low-dose Lantus last night and blood sugars  have been 201, 211, 121.  We will go ahead and continue Lantus at 20  units.  I have asked her to discontinue her 70/30 for now until she is  able to talk to her doctor.  In the meantime she may continue taking her  metformin and Januvia.  She has been asked to check her blood sugars  three times a day and maintain a record for her doctor.   Her HbA1c unfortunately was not checked.  Her sodium level when she came  in was 128, BUN was 25, creatinine 1.0.  She was put on D5.  Sodium  levels the following day was 117.  We changed her IV fluid to  normal  saline.  Sodium has come up to 128 this morning and renal function is  normal.  Glucose was 173, potassium is 4.4.   Rest of her medical issues are stable.   She also complained of nausea and since she does have diabetes we  suspected diabetic gastroparesis.  An EGD done last year did not show  any obstruction so I went ahead and started this patient on Reglan.  She  will require follow-up for this as an outpatient with her  gastroenterologist.   On the day of discharge the patient otherwise is feeling well.  She is  ambulating with no difficulties.  Denies any complaints and nausea as  resolved.  Vital signs are all stable.  Lungs are clear to auscultation.  Cardiovascular exam is unremarkable.  Abdomen is soft.   Labs have been discussed above.   DISCHARGE MEDICATIONS:  1. New medication Reglan 5 mg p.o. q.a.c. and h.s.  2. New dose Lantus 20 units subcu q.h.s.  3. She has been asked to discontinue NovoLog 70/30.  She may continue      the other medications as below:  4. Metformin 1000 mg b.i.d.  5. Synthroid 50 mcg daily.  6. Verapamil 120 mg b.i.d.  7. Simvastatin 20 mg q.p.m.  8. Citalopram 20 mg daily.  9. Januvia 100 mg daily.  10.Multivitamin 1 tablet daily.  11.Vitamin C daily.  12.B complex daily.  13.Zantac 2 tablets daily.   Follow up with Dr. Phillips Odor early next week.  She has been given a note  to stay off work through this weekend.  She has been asked to maintain a  diary of her CBGs for her PMD and she has been told that if her blood  sugar stays above 200 consistently she may go ahead and increase the  dose of Lantus by 5 units.   RECOMMENDATIONS:  PMD to refer her back to gastroenterology for her  possible gastroparesis and she may require a gastric emptying study.   DIET:  Modified carbohydrate.  She has been asked to avoid drinking too  much water for the next few days to avoid hyponatremia.  Repeat BMET  will also be checked in one week's time  to follow up on her sodium  levels.   PHYSICAL ACTIVITY:  As before.   Total time on this discharge encounter was 35 minutes.      Osvaldo Shipper, MD  Electronically Signed     GK/MEDQ  D:  02/22/2009  T:  02/22/2009  Job:  [id number]   cc:   Corrie Mckusick, M.D.  Fax: [phone]

## 2011-05-13 NOTE — H&P (Signed)
NAMEALBANA, [name]            ACCOUNT NO.:  [id number]   MEDICAL RECORD NO.:  [id number]          PATIENT TYPE:  INP   LOCATION:  A338                          FACILITY:  [facility]   PHYSICIAN:  Skeet Latch, DO    DATE OF BIRTH:  October 03, 1951   DATE OF ADMISSION:  02/19/2009  DATE OF DISCHARGE:  LH                              HISTORY & PHYSICAL   PRIMARY CARE Christe Tellez:  Dr. Phillips Odor.   CHIEF COMPLAINT:  Low blood sugar.   HISTORY OF PRESENT ILLNESS:  This is a 60 year old female who presented  with low blood sugars.  Apparently, the patient woke up in the morning  and noted her blood sugar was 50, and it dropped into the 30s range.  The patient took orange juice and increased it to 114, but it was going  up and down all day.  The patient had been in touch with her primary  care physician, who advised her to continue her metformin and insulin.  Later on in the evening, she noted the sugar was 139.  Since then, it  has dropped.  She started having chills, malaise.  The patient states  that she has been treated for a urinary tract infection with Septra.  This is day 7 of a 10-day course.  She again told her primary care  physician, and he told her to come to the emergency room secondary to  her low blood sugars.   PAST MEDICAL HISTORY:  Includes hypertension, diabetes, hypothyroidism.   SOCIAL HISTORY:  No history of smoking, alcohol, or illicit drug use.   ALLERGIES:  DEMEROL.   HOME MEDICATIONS:  1. Metformin 1000 mg twice a day.  2. Levothyroxine 50 mg daily.  3. Verapamil 120 mg twice a day.  4. Simvastatin 20 mg daily.  5. Citalopram 20 mg daily.  6. Januvia 100 mg once a day.  7. Lantus 50 units subcutaneous every evening.  8. NovoLog subcutaneous 30 units twice a day.  9. Multivitamin daily.  10.Vitamin C daily.  11.B vitamin daily.  12.Cipro unknown dose twice a day.   REVIEW OF SYSTEMS:  GENERAL:  Positive for chills and fatigue.  HEENT:  Unremarkable.   CARDIOVASCULAR:  No chest pain or palpitations.  RESPIRATORY:  No cardiovascular, dyspnea, or wheezing.  GASTROINTESTINAL:  Positive for some nausea.  No vomiting or diarrhea.  No abdominal pain.  GENITOURINARY:  No dysuria, urgency, or frequency.  MUSCULOSKELETAL:  Unremarkable.  SKIN:  Unremarkable.  NEURO:  Unremarkable.  PSYCH:  Unremarkable.   PHYSICAL EXAM:  VITAL SIGNS:  Temperature 98.9, pulse 79, respirations  20, blood pressure 162/78, she is saturating 97% on room air.  CONSTITUTIONAL:  She is well-developed, well-nourished, well-hydrated,  in no acute distress.  HEENT:  Normocephalic, atraumatic.  Eyes PERRLA, EOMI.  NECK:  Soft, supple, nontender, nondistended.  CARDIOVASCULAR:  Regular rate and rhythm.  No murmurs, rubs, or gallops.  LUNGS:  Clear to auscultation bilaterally.  No rhonchi, rales, or  wheezing.  ABDOMEN:  Soft, nontender, nondistended.  Positive bowel sounds.  EXTREMITIES:  No cyanosis, clubbing, or edema.  NEUROLOGIC:  Cranial nerves  2-12 grossly intact.  The patient alert and  oriented x3.   LABORATORY STUDIES:  White count 12,100, hemoglobin 14.5, hematocrit  41.8, platelet count is 267,000.  Sodium 128, potassium 4.6, chloride  92, CO2 30.  Current glucose is 113, BUN 25, creatinine 1.07, calcium  9.8.  Urinalysis is unremarkable.   ASSESSMENT:  1. Hypoglycemia.  2. History of insulin dependent diabetes.  3. History of hypertension.  4. History of hypothyroidism.   PLAN:  1. The patient admitted to service of [facility].  General medical bed.  2. Blood sugars will be monitored and will hold her diabetes      medications at this time.  Once her sugars are in more moderate      reasonable range, we will continue with her medications and sliding      scale.  3. Hypertension.  The patient will be placed on her home medications.  4. Hyperlipidemia.  The patient will be place on her simvastatin      daily.  5. The patient appeared to have a urinary  tract infection at this      time.  Will watch for any signs of infections.  6. The patient will need DVT as well as GI prophylaxis.      Skeet Latch, DO  Electronically Signed     SM/MEDQ  D:  02/21/2009  T:  02/21/2009  Job:  [id number]   cc:   Corrie Mckusick, M.D.  Fax: [phone]

## 2011-05-13 NOTE — Op Note (Signed)
[name], [name]            ACCOUNT NO.:  [id number]   MEDICAL RECORD NO.:  [id number]          PATIENT TYPE:  AMB   LOCATION:  DAY                           FACILITY:  [facility]   PHYSICIAN:  Kassie Mends, M.D.      DATE OF BIRTH:  16-Mar-1951   DATE OF PROCEDURE:  04/06/2008  DATE OF DISCHARGE:                                PROCEDURE NOTE   REFERRING PHYSICIAN:  Corrie Mckusick, MD.   PROCEDURE:  1. Ileocolonoscopy with random cold forceps biopsy.  2. Esophagogastroduodenoscopy with cold forceps biopsy of the gastric      and duodenal mucosa.   INDICATIONS FOR EXAM:  [name] is a 60 year old lady who has had  intermittent diarrhea for years but persistent diarrhea for the last 4-6  weeks.  She does consume milk and cheese.  She states she consumes  cheese twice a month.  She has milk with her cereal.  She does have  diabetes.  She uses aspirin daily.  She is also complaining of a feeling  like fire in her upper abdomen into her chest.  She complains of  heartburn but is not on an acid suppressing medicine.   FINDINGS:  1. Normal terminal ileum, approximately 15-20 cm visualized.  2. Frequent sigmoid colon diverticula.  Biopsies obtained via cold      forceps to evaluate for microscopi colitis. Otherwise, no polyps,      masses, inflammatory changes or AVMs seen.  3. Normal retroflexed view of the rectum.  4. Linear erosions with occasional ulceration in the distal esophagus      at the GE junction.  Patent fibrous ring.  Otherwise, no evidence      of Barrett's or mass.  5. A 3-4 cm sliding hiatal hernia.  Patchy erythema with occasional      erosion seen in the antrum near the pylorus.  Biopsies obtained via      cold forceps to evaluate for H. pylori gastritis.  6. Normal duodenal bulb ampulla in second portion of the duodenum.      Biopsies obtained via cold forceps to evaluate for celiac sprue due      to her complaint of diarrhea.   DIAGNOSES:  1. [name]  likely has erosive esophagitis as the reason for her      sensation of feeling like there is a feeling of fire, and      heartburn.  2. No source for [name]'s diarrhea identified.   RECOMMENDATIONS:  1. We will add Nexium 40 mg daily 30 minutes before breakfast.  2. We will call her with results of her biopsies.  If she has no      evidence of celiac sprue or microscopic colitis, then we would      consider a hydrogen breath test to evaluate for lactose      intolerance, fructose intolerance, or small bowel bacterial      overgrowth considering she has been diabetic for approximately 12      years.  3. Screening colonoscopy in 10 years.  4. No aspirin, NSAIDs for 14 days.  No  anticoagulation for 5 days.  5. She should avoid gastric irritants.  She is given a handout on      gastric irritants.  She is also given information on reflux      gastritis and hiatal hernia.  6. She should follow high fiber diet.  She is given a handout on high-      fiber diet and diverticulosis.  7. Follow up in 6 weeks with Dr. Cira Servant.   MEDICATIONS:  1. Fentanyl 100 mcg IV.  2. Versed 5 mg IV.   PROCEDURE TECHNIQUE:  Physical exam was performed.  Informed consent was  obtained from the patient explaining the benefits, risks and  alternatives to the procedure.  The patient was connected to monitor and  placed in the left lateral position.  Continuous oxygen was provided by  nasal cannula, IV medicine administered through an indwelling cannula.  After administration, sedation, and rectal exam, the patient's rectum  was intubated.  Scope was advanced under direct visualization to the  distal terminal ileum.  The scope was removed slowly by carefully  examining the color, texture, anatomy and integrity of the mucosa on the  way out.   After the colonoscopy, the patient's esophagus was intubated with a  diagnostic gastroscope and scope was advanced under direct visualization  to the second portion  of the duodenum.  The scope was removed slowly by  carefully examining the color, texture, anatomy and integrity of the  mucosa on the way out.  The patient was recovered in Endoscopy and  discharged home in satisfactory condition.   PATH:  Reactive gastropathy. No celiac sprue, or microscopic colitis.      Kassie Mends, M.D.  Electronically Signed     SM/MEDQ  D:  04/06/2008  T:  04/06/2008  Job:  [id number]   cc:   Corrie Mckusick, M.D.  Fax: [phone]

## 2011-06-04 IMAGING — CT CT ABD-PELV W/ CM
2 of 4 series · 16 of 46 positions shown, 18 images · IV contrast (Omnipaque 300)
Comparison: Abdominal radiograph performed 05/19/2010

CLINICAL DATA: Left lower quadrant abdominal pain and nausea.

CT ABDOMEN AND PELVIS WITH CONTRAST
TECHNIQUE: Multidetector CT imaging of the abdomen and pelvis was
performed following the standard protocol during bolus
administration of intravenous contrast.
Contrast: 80 mL of Omnipaque 300 IV contrast

[Series 2: abd_pel_with 5.0 b40f · axial · 0.59mm/px · z∈[+632,+1007]mm · 13 of 85 slices shown, 15 images]
[im 5/85  soft-tissue]
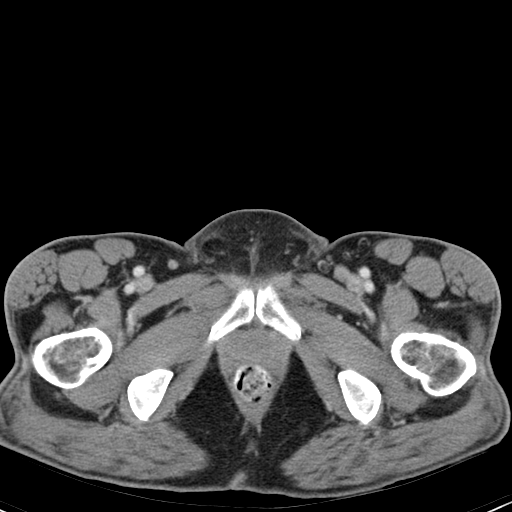
[im 5/85  bone]
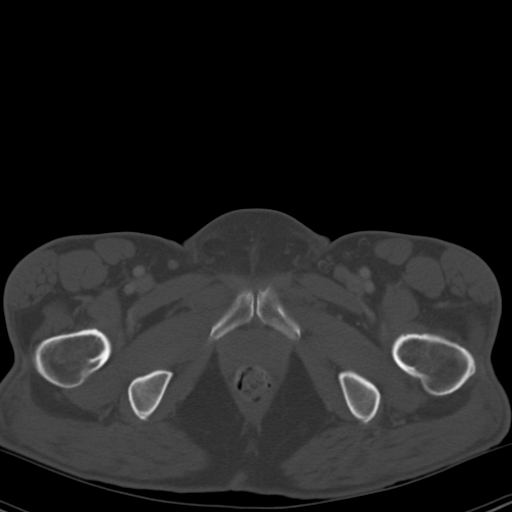
[im 14/85  soft-tissue]
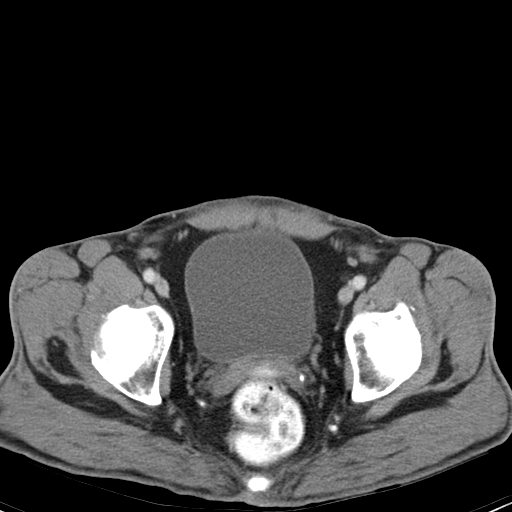
[im 18/85  soft-tissue]
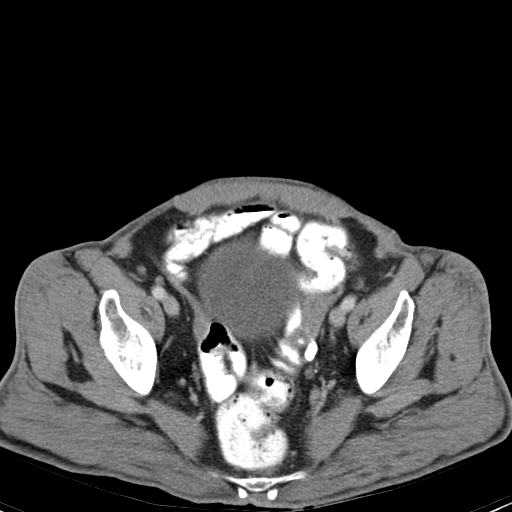
[im 23/85  soft-tissue]
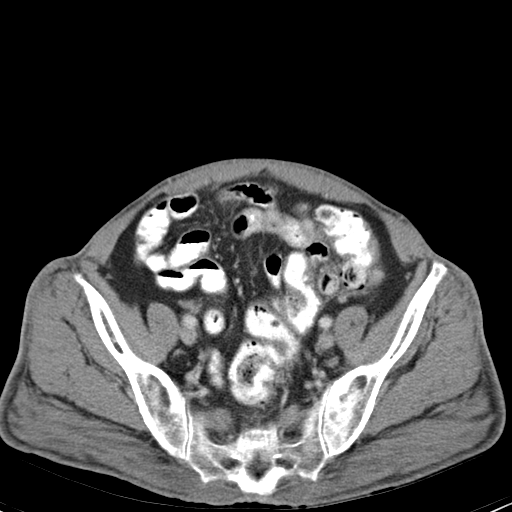
[im 31/85  soft-tissue]
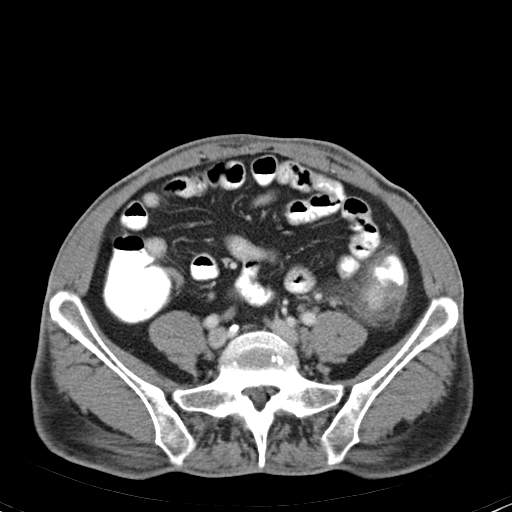
[im 36/85  soft-tissue]
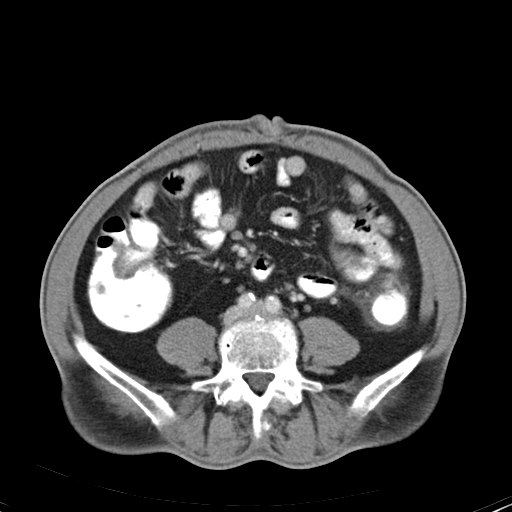
[im 45/85  soft-tissue]
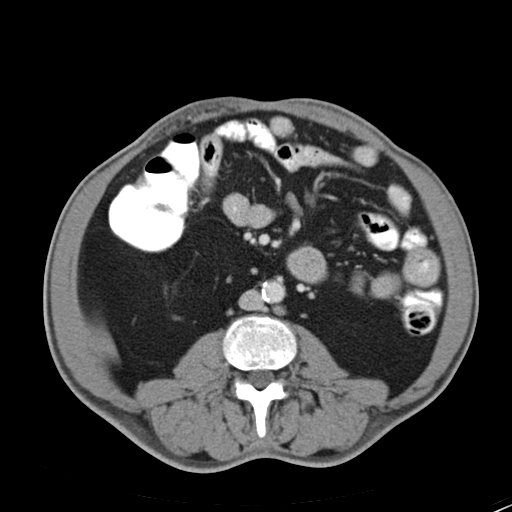
[im 49/85  soft-tissue]
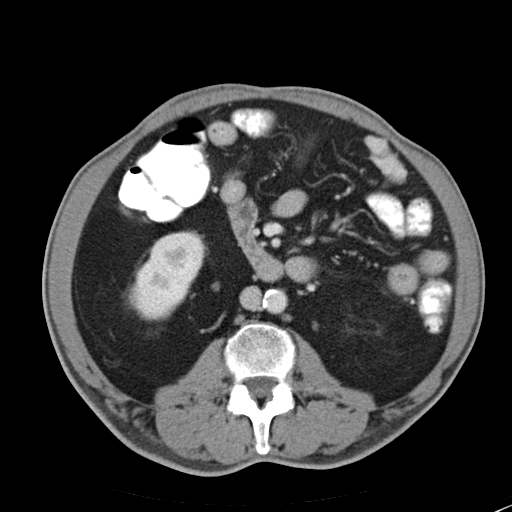
[im 54/85  soft-tissue]
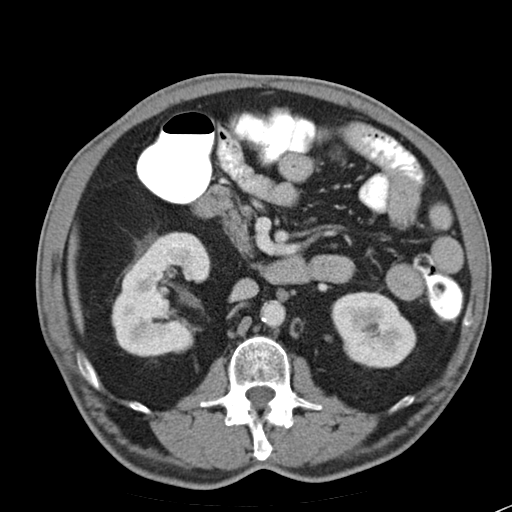
[im 54/85  bone]
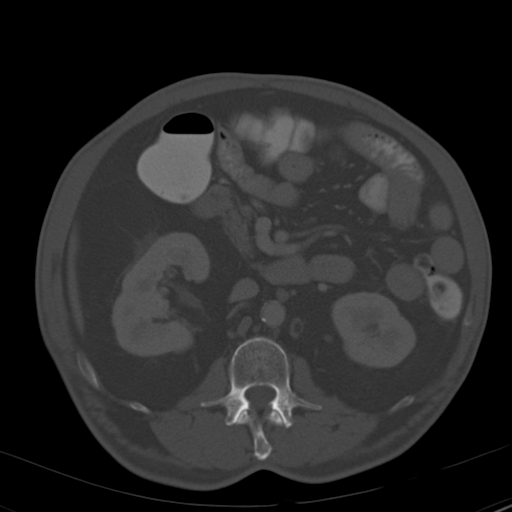
[im 62/85  soft-tissue]
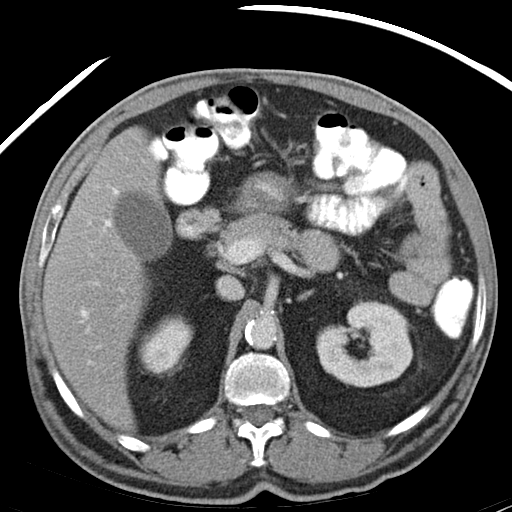
[im 67/85  soft-tissue]
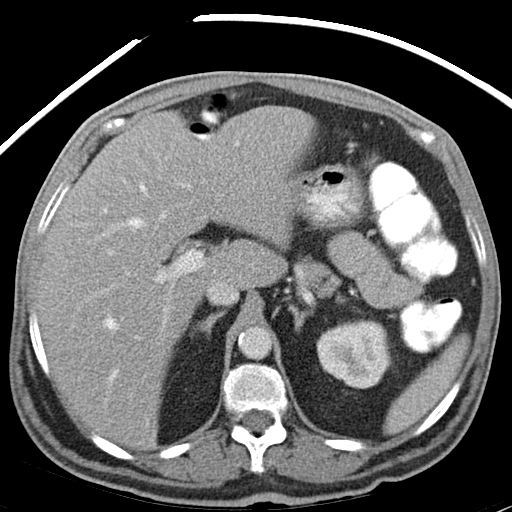
[im 71/85  soft-tissue]
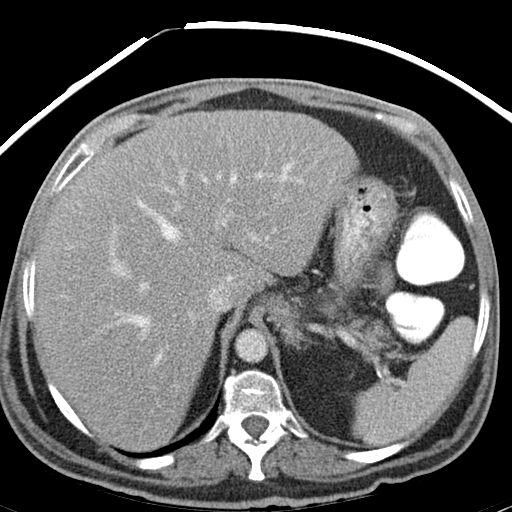
[im 80/85  soft-tissue]
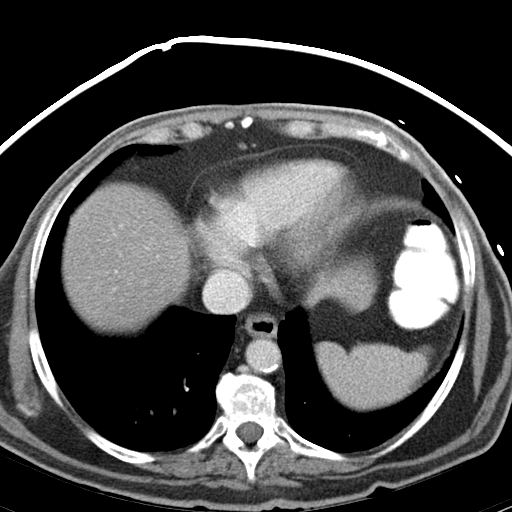

[Series 4: mpr cor post contrast (id) · coronal · 0.68mm/px · 3 of 100 slices shown]
[im 34/100  soft-tissue]
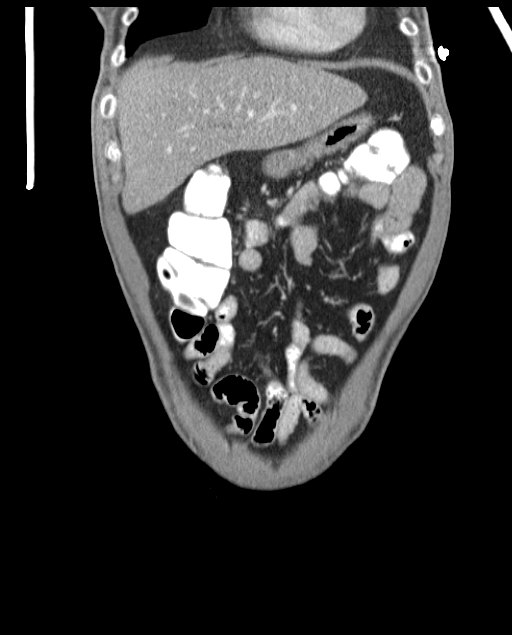
[im 45/100  soft-tissue]
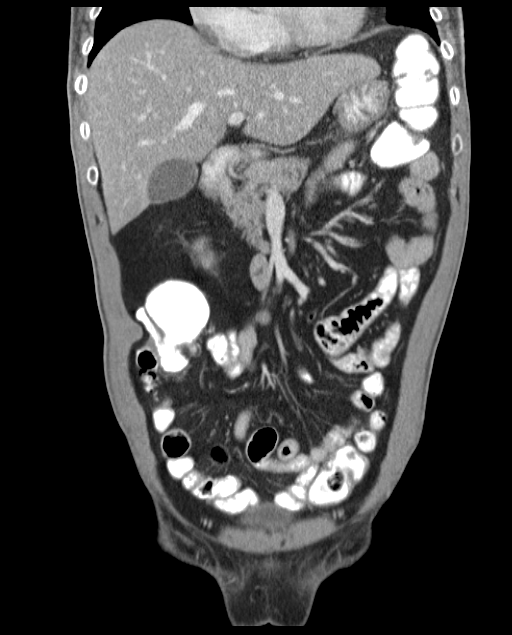
[im 56/100  soft-tissue]
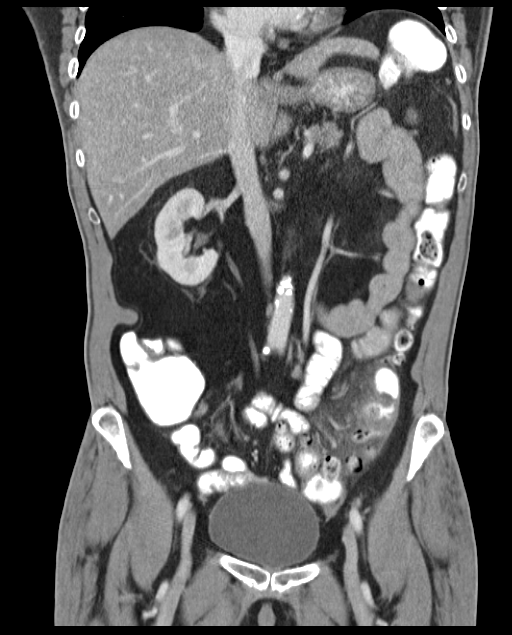

[16 of 46 positions shown; findings below may reference images not displayed]

FINDINGS: The visualized lung bases are clear.

The liver is unremarkable in appearance.  A vague 1.0 cm
hypodensity is noted within the spleen; this is not well
characterized, but could reflect prior injury or a small cyst.  The
gallbladder is within normal limits.  The pancreas and adrenal
glands are unremarkable.

Mild nonspecific perinephric stranding is noted bilaterally, more
prominent on the right.  Small hypodensities within both kidneys
are too small to further characterize but likely reflect cysts.

No free fluid is identified.  The small bowel is unremarkable in
appearance.  The stomach is within normal limits.  No acute
vascular abnormalities are seen. Scattered calcification is noted
along the abdominal aorta and its branches.

There is significant focal soft tissue inflammation surrounding the
distal descending and proximal sigmoid colon, with multiple large
diverticula filled with stool.  This is most compatible with an
acute diverticulitis.  No definite perforation or abscess formation
is seen.

Additional diverticula are noted along the more proximal descending
colon.  Contrast progresses to the level of the rectum.  The
appendix is normal in caliber, without evidence for acute
appendicitis.

The bladder is mildly distended and is within normal limits.  The
patient is status post hysterectomy.  The ovaries are unremarkable
in appearance.  No suspicious adnexal masses are seen.  No inguinal
lymphadenopathy is appreciated.

No acute osseous abnormalities are identified.
IMPRESSION: 1.  Acute diverticulitis involving the distal descending and
proximal sigmoid colon, with significant soft tissue inflammation
and several associated large diverticula filled with stool.  No
definite perforation or abscess formation seen.

2.  Likely small renal cysts; vague splenic hypodensity may reflect
a cyst or sequela of prior injury.

## 2011-06-26 ENCOUNTER — Ambulatory Visit (HOSPITAL_COMMUNITY)
Admission: RE | Admit: 2011-06-26 | Discharge: 2011-06-26 | Disposition: A | Discharge status: Home / Self Care | Source: Ambulatory Visit | Attending: Family Medicine | Admitting: Family Medicine

## 2011-06-26 ENCOUNTER — Encounter (HOSPITAL_COMMUNITY): Payer: Self-pay

## 2011-06-26 ENCOUNTER — Other Ambulatory Visit (HOSPITAL_COMMUNITY): Payer: Self-pay | Admitting: Family Medicine

## 2011-06-26 DIAGNOSIS — R51 Headache: Secondary | ICD-10-CM

## 2011-06-26 DIAGNOSIS — G43009 Migraine without aura, not intractable, without status migrainosus: Secondary | ICD-10-CM

## 2011-06-26 DIAGNOSIS — R110 Nausea: Secondary | ICD-10-CM | POA: Insufficient documentation

## 2011-06-26 HISTORY — DX: Essential (primary) hypertension: I10

## 2011-10-03 ENCOUNTER — Other Ambulatory Visit (HOSPITAL_COMMUNITY): Payer: Self-pay | Admitting: Family Medicine

## 2011-10-03 ENCOUNTER — Ambulatory Visit (HOSPITAL_COMMUNITY)
Admission: RE | Admit: 2011-10-03 | Discharge: 2011-10-03 | Disposition: A | Discharge status: Home / Self Care | Source: Ambulatory Visit | Attending: Family Medicine | Admitting: Family Medicine

## 2011-10-03 DIAGNOSIS — M25539 Pain in unspecified wrist: Secondary | ICD-10-CM

## 2011-10-03 DIAGNOSIS — M899 Disorder of bone, unspecified: Secondary | ICD-10-CM | POA: Insufficient documentation

## 2011-12-11 ENCOUNTER — Encounter: Payer: Self-pay | Admitting: Cardiology

## 2012-09-28 ENCOUNTER — Ambulatory Visit (INDEPENDENT_AMBULATORY_CARE_PROVIDER_SITE_OTHER): Payer: Medicare Other | Admitting: Family Medicine

## 2012-09-28 ENCOUNTER — Encounter: Payer: Self-pay | Admitting: Family Medicine

## 2012-09-28 VITALS — BP 150/78 | HR 79 | Resp 15 | Ht <= 58 in | Wt 108.8 lb

## 2012-09-28 DIAGNOSIS — M159 Polyosteoarthritis, unspecified: Secondary | ICD-10-CM

## 2012-09-28 DIAGNOSIS — IMO0001 Reserved for inherently not codable concepts without codable children: Secondary | ICD-10-CM | POA: Insufficient documentation

## 2012-09-28 DIAGNOSIS — E039 Hypothyroidism, unspecified: Secondary | ICD-10-CM

## 2012-09-28 DIAGNOSIS — E119 Type 2 diabetes mellitus without complications: Secondary | ICD-10-CM

## 2012-09-28 DIAGNOSIS — F329 Major depressive disorder, single episode, unspecified: Secondary | ICD-10-CM

## 2012-09-28 DIAGNOSIS — E785 Hyperlipidemia, unspecified: Secondary | ICD-10-CM

## 2012-09-28 DIAGNOSIS — F3289 Other specified depressive episodes: Secondary | ICD-10-CM

## 2012-09-28 DIAGNOSIS — F32A Depression, unspecified: Secondary | ICD-10-CM | POA: Insufficient documentation

## 2012-09-28 DIAGNOSIS — Z23 Encounter for immunization: Secondary | ICD-10-CM

## 2012-09-28 DIAGNOSIS — I10 Essential (primary) hypertension: Secondary | ICD-10-CM

## 2012-09-28 MED ORDER — DIPHENOXYLATE-ATROPINE 2.5-0.025 MG PO TABS: 1.0000 | ORAL_TABLET | Freq: Four times a day (QID) | ORAL | Status: DC | PRN

## 2012-09-28 MED ORDER — ZOLPIDEM TARTRATE 10 MG PO TABS: 10.0000 mg | ORAL_TABLET | Freq: Every evening | ORAL | Status: DC | PRN

## 2012-09-28 MED ORDER — HYDROCODONE-ACETAMINOPHEN 2.5-500 MG PO TABS: 1.0000 | ORAL_TABLET | Freq: Four times a day (QID) | ORAL | Status: DC | PRN

## 2012-09-28 NOTE — Assessment & Plan Note (Signed)
DOes not tolerate statins or fenofibrates secondary to muscle aches Fish oil used

## 2012-09-28 NOTE — Assessment & Plan Note (Signed)
Management per endocrine 

## 2012-09-28 NOTE — Assessment & Plan Note (Signed)
BP elevated today, she typically well controlled a little anxious today, no change to meds will trend, labs to be obtained from endocrine, review PCP notes

## 2012-09-28 NOTE — Progress Notes (Signed)
  Subjective:    Patient ID: [name], female    DOB: [birth year], 61 y.o.   MRN: [id number]  HPI Pt here to establish care, previous PCP [facility]  Endocrine- Dr. Fransico Him GI- Dr. Jena Gauss  She has no specific concerns today. Her medications need to be refilled Diabetes mellitus since 06/02/1996. She's been on insulin pump for the past 3 years. Her diabetes as well as her hypothyroidism is being managed by endocrinology. Hyperlipidemia she does not tolerate statin therapy or fenofibrate. She's currently on fish oil Depression/anxiety-she is history depression anxiety she's been on Celexa since 06/03/2007 one her husband died. She has no children and she has a brother whom she does not speak with. She takes a half a tablet of the Celexa half a tablet of Ambien as needed for sleep. She's very anxious right now she is going out of town and will be leaving her pet   Last Mammogram 2 years ago Review of Systems  GEN- denies fatigue, fever, weight loss,weakness, recent illness HEENT- denies eye drainage, change in vision, nasal discharge, CVS- denies chest pain, palpitations RESP- denies SOB, cough, wheeze ABD- denies N/V, change in stools, abd pain GU- denies dysuria, hematuria, dribbling, incontinence MSK- + joint pain, muscle aches, injury Neuro- denies headache, dizziness, syncope, seizure activity      Objective:   Physical Exam GEN- NAD, alert and oriented x3 HEENT- PERRL, EOMI, non injected sclera, pink conjunctiva, MMM, oropharynx clear Neck- Supple,  CVS- RRR, no murmur RESP-CTAB ABD-NABS,soft,NT,ND, Insulin pump EXT- No edema, curvature and decreased ROM hands  Pulses- Radial, DP- 2+ Psych-normal affect and Mood       Assessment & Plan:

## 2012-09-28 NOTE — Patient Instructions (Signed)
Medications refilled I will get records from Dr. Fransico Him and Robbie Lis Enjoy your trip Flu shot given  F/U 3 months

## 2012-09-28 NOTE — Assessment & Plan Note (Signed)
Continue celexa and [employer]

## 2012-09-28 NOTE — Assessment & Plan Note (Signed)
On replacement hormone by endocrine

## 2012-12-21 ENCOUNTER — Other Ambulatory Visit: Payer: Self-pay

## 2012-12-21 MED ORDER — LISINOPRIL 40 MG PO TABS: 40.0000 mg | ORAL_TABLET | Freq: Every day | ORAL | Status: DC

## 2012-12-21 MED ORDER — VERAPAMIL HCL 120 MG PO TABS: 120.0000 mg | ORAL_TABLET | Freq: Two times a day (BID) | ORAL | Status: DC

## 2012-12-21 MED ORDER — CITALOPRAM HYDROBROMIDE 40 MG PO TABS: 20.0000 mg | ORAL_TABLET | Freq: Every day | ORAL | Status: AC

## 2012-12-23 ENCOUNTER — Other Ambulatory Visit: Payer: Self-pay

## 2012-12-23 ENCOUNTER — Telehealth: Payer: Self-pay

## 2012-12-23 MED ORDER — OMEPRAZOLE 20 MG PO CPDR: 20.0000 mg | DELAYED_RELEASE_CAPSULE | Freq: Every day | ORAL | Status: DC

## 2012-12-23 NOTE — Telephone Encounter (Signed)
Med sent.

## 2012-12-23 NOTE — Telephone Encounter (Signed)
Okay to send 3 refills

## 2012-12-30 ENCOUNTER — Ambulatory Visit (INDEPENDENT_AMBULATORY_CARE_PROVIDER_SITE_OTHER): Payer: Medicare Other | Admitting: Family Medicine

## 2012-12-30 ENCOUNTER — Encounter: Payer: Self-pay | Admitting: Family Medicine

## 2012-12-30 VITALS — BP 132/68 | HR 97 | Resp 16 | Ht <= 58 in | Wt 108.1 lb

## 2012-12-30 DIAGNOSIS — IMO0001 Reserved for inherently not codable concepts without codable children: Secondary | ICD-10-CM

## 2012-12-30 DIAGNOSIS — M8580 Other specified disorders of bone density and structure, unspecified site: Secondary | ICD-10-CM | POA: Insufficient documentation

## 2012-12-30 DIAGNOSIS — Z794 Long term (current) use of insulin: Secondary | ICD-10-CM

## 2012-12-30 DIAGNOSIS — M159 Polyosteoarthritis, unspecified: Secondary | ICD-10-CM

## 2012-12-30 DIAGNOSIS — M949 Disorder of cartilage, unspecified: Secondary | ICD-10-CM

## 2012-12-30 DIAGNOSIS — E119 Type 2 diabetes mellitus without complications: Secondary | ICD-10-CM

## 2012-12-30 DIAGNOSIS — E785 Hyperlipidemia, unspecified: Secondary | ICD-10-CM

## 2012-12-30 DIAGNOSIS — M899 Disorder of bone, unspecified: Secondary | ICD-10-CM

## 2012-12-30 DIAGNOSIS — I10 Essential (primary) hypertension: Secondary | ICD-10-CM

## 2012-12-30 MED ORDER — VERAPAMIL HCL 120 MG PO TABS: 120.0000 mg | ORAL_TABLET | Freq: Two times a day (BID) | ORAL | Status: DC

## 2012-12-30 NOTE — Assessment & Plan Note (Signed)
Uncontrolled diabetes mellitus she is followed by endocrinology her insulin pump has been discontinued and she is now on injections She will need some improvement in her blood sugars which have been 200 to 300s before her cataract surgery in 3 weeks

## 2012-12-30 NOTE — Progress Notes (Signed)
  Subjective:    Patient ID: [name], female    DOB: 11/11/1951, 62 y.o.   MRN: [id number]  HPI  Patient here to follow chronic medical problems. She has no specific concerns. She's been doing well. Her immunizations are up-to-date. She does decline mammogram.  Diabetes mellitus she's currently followed by endocrinology and NovoLog using the insulin pump because her blood sugars did not respond well to this. She's currently on Levemir 50 units twice a day and NovoLog sliding scale insulin. Her last A1c was 8.2%. CBG 200-300's.  She is scheduled to have cataract surgery on January 21.  Osteopenia she last had bone density in 2008 she's currently on vitamin D this needs to be rechecked.  Review of Systems   GEN- denies fatigue, fever, weight loss,weakness, recent illness HEENT- denies eye drainage, change in vision, nasal discharge, CVS- denies chest pain, palpitations RESP- denies SOB, cough, wheeze ABD- denies N/V, change in stools, abd pain GU- denies dysuria, hematuria, dribbling, incontinence MSK- denies joint pain, muscle aches, injury Neuro- denies headache, dizziness, syncope, seizure activity      Objective:   Physical Exam GEN- NAD, alert and oriented x3 HEENT- PERRL, EOMI, non injected sclera, , MMM, oropharynx clear, Right cataract Neck- Supple, no bruit CVS- RRR, no murmur RESP-CTAB ABD-NABS,soft,NT,ND,  EXT- No edema Pulses- Radial, DP- 2+ Psych-normal affect and Mood       Assessment & Plan:

## 2012-12-30 NOTE — Assessment & Plan Note (Signed)
Doing well rare use of Vicodin

## 2012-12-30 NOTE — Assessment & Plan Note (Signed)
Continue current medications. Blood pressure recheck was much improved.

## 2012-12-30 NOTE — Assessment & Plan Note (Addendum)
Plan for recheck bone density she is currently on vitamin D Discussed importance of weightbearing activities

## 2012-12-30 NOTE — Patient Instructions (Addendum)
Continue current medications I will schedule for bone density  Medications to be refilled F/U 3 months

## 2013-01-07 ENCOUNTER — Other Ambulatory Visit: Payer: Self-pay

## 2013-01-07 MED ORDER — LEVOTHYROXINE SODIUM 50 MCG PO TABS: 50.0000 ug | ORAL_TABLET | Freq: Every day | ORAL | Status: DC

## 2013-02-02 ENCOUNTER — Ambulatory Visit (HOSPITAL_COMMUNITY)
Admission: RE | Admit: 2013-02-02 | Discharge: 2013-02-02 | Disposition: A | Discharge status: Home / Self Care | Payer: Medicare Other | Source: Ambulatory Visit | Attending: Family Medicine | Admitting: Family Medicine

## 2013-02-02 DIAGNOSIS — M949 Disorder of cartilage, unspecified: Secondary | ICD-10-CM | POA: Insufficient documentation

## 2013-02-02 DIAGNOSIS — M8580 Other specified disorders of bone density and structure, unspecified site: Secondary | ICD-10-CM

## 2013-02-02 DIAGNOSIS — Z780 Asymptomatic menopausal state: Secondary | ICD-10-CM | POA: Insufficient documentation

## 2013-02-02 DIAGNOSIS — M899 Disorder of bone, unspecified: Secondary | ICD-10-CM | POA: Insufficient documentation

## 2013-02-03 ENCOUNTER — Other Ambulatory Visit: Payer: Self-pay | Admitting: Family Medicine

## 2013-02-03 MED ORDER — ALENDRONATE SODIUM 35 MG PO TABS: 35.0000 mg | ORAL_TABLET | ORAL | Status: DC

## 2013-04-01 ENCOUNTER — Ambulatory Visit (INDEPENDENT_AMBULATORY_CARE_PROVIDER_SITE_OTHER): Payer: Medicare Other | Admitting: Family Medicine

## 2013-04-01 ENCOUNTER — Telehealth: Payer: Self-pay

## 2013-04-01 ENCOUNTER — Encounter: Payer: Self-pay | Admitting: Family Medicine

## 2013-04-01 VITALS — BP 100/60 | HR 87 | Resp 16 | Wt 113.0 lb

## 2013-04-01 DIAGNOSIS — F329 Major depressive disorder, single episode, unspecified: Secondary | ICD-10-CM

## 2013-04-01 DIAGNOSIS — F3289 Other specified depressive episodes: Secondary | ICD-10-CM

## 2013-04-01 DIAGNOSIS — IMO0001 Reserved for inherently not codable concepts without codable children: Secondary | ICD-10-CM

## 2013-04-01 DIAGNOSIS — E785 Hyperlipidemia, unspecified: Secondary | ICD-10-CM

## 2013-04-01 DIAGNOSIS — M8580 Other specified disorders of bone density and structure, unspecified site: Secondary | ICD-10-CM

## 2013-04-01 DIAGNOSIS — G4700 Insomnia, unspecified: Secondary | ICD-10-CM

## 2013-04-01 DIAGNOSIS — M949 Disorder of cartilage, unspecified: Secondary | ICD-10-CM

## 2013-04-01 DIAGNOSIS — I10 Essential (primary) hypertension: Secondary | ICD-10-CM

## 2013-04-01 DIAGNOSIS — F5104 Psychophysiologic insomnia: Secondary | ICD-10-CM | POA: Insufficient documentation

## 2013-04-01 DIAGNOSIS — Z794 Long term (current) use of insulin: Secondary | ICD-10-CM

## 2013-04-01 DIAGNOSIS — F32A Depression, unspecified: Secondary | ICD-10-CM

## 2013-04-01 DIAGNOSIS — M899 Disorder of bone, unspecified: Secondary | ICD-10-CM

## 2013-04-01 DIAGNOSIS — E119 Type 2 diabetes mellitus without complications: Secondary | ICD-10-CM

## 2013-04-01 MED ORDER — VERAPAMIL HCL 120 MG PO TABS: 120.0000 mg | ORAL_TABLET | Freq: Two times a day (BID) | ORAL | Status: AC

## 2013-04-01 MED ORDER — LISINOPRIL 40 MG PO TABS: 40.0000 mg | ORAL_TABLET | Freq: Every day | ORAL | Status: AC

## 2013-04-01 MED ORDER — HYDROCODONE-ACETAMINOPHEN 2.5-500 MG PO TABS: 1.0000 | ORAL_TABLET | Freq: Four times a day (QID) | ORAL | Status: DC | PRN

## 2013-04-01 MED ORDER — ZOLPIDEM TARTRATE 10 MG PO TABS: 10.0000 mg | ORAL_TABLET | Freq: Every evening | ORAL | Status: DC | PRN

## 2013-04-01 MED ORDER — OMEPRAZOLE 20 MG PO CPDR: 20.0000 mg | DELAYED_RELEASE_CAPSULE | Freq: Every day | ORAL | Status: DC

## 2013-04-01 NOTE — Assessment & Plan Note (Signed)
Worsened bone density, pt declines bisphosphonates, Calcium 1200mg , Vit D 1000IU

## 2013-04-01 NOTE — Telephone Encounter (Signed)
Lab to be done

## 2013-04-01 NOTE — Assessment & Plan Note (Signed)
Will have this rechecked with her endocrine labs, nurse will call to add this on if not done on Monday

## 2013-04-01 NOTE — Progress Notes (Signed)
  Subjective:    Patient ID: [name], female    DOB: 1951-01-12, 62 y.o.   MRN: [id number]  HPI  Patient here to followup chronic medical problems. She has no specific concerns. She is status post bilateral cataract surgery. She's been followed by endocrinology for her diabetes mellitus which is uncontrolled. She had recent adjustments this week. She had fasting labs done at their office. She declines taking the Fosamax because she read all the side effects.   Review of Systems   GEN- denies fatigue, fever, weight loss,weakness, recent illness HEENT- denies eye drainage, change in vision, nasal discharge, CVS- denies chest pain, palpitations RESP- denies SOB, cough, wheeze ABD- denies N/V, change in stools, abd pain GU- denies dysuria, hematuria, dribbling, incontinence MSK- denies joint pain, muscle aches, injury Neuro- denies headache, dizziness, syncope, seizure activity      Objective:   Physical Exam GEN- NAD, alert and oriented x3 HEENT- PERRL, EOMI, non injected sclera, pink conjunctiva, MMM, oropharynx clear CVS- RRR, no murmur RESP-CTAB EXT- No edema Pulses- Radial, DP- 2+ Psych- normal affect and mood       Assessment & Plan:

## 2013-04-01 NOTE — Assessment & Plan Note (Signed)
Obtain labs and note from Dr. Fransico Him

## 2013-04-01 NOTE — Assessment & Plan Note (Signed)
Doing well on ambien, no falls

## 2013-04-01 NOTE — Assessment & Plan Note (Signed)
Stable no change to meds 

## 2013-04-01 NOTE — Patient Instructions (Signed)
I will get labs from Dr. Fransico Him Calcium 1200mg  and Vitamin D 1000 units each day Continue weight bearing exercises will help build up your bones Medications to be refilled F/U 4 months

## 2013-04-01 NOTE — Assessment & Plan Note (Signed)
Well controlled 

## 2013-04-05 ENCOUNTER — Other Ambulatory Visit: Payer: Self-pay | Admitting: Family Medicine

## 2013-04-05 LAB — LIPID PANEL: Total CHOL/HDL Ratio: 8.6 Ratio

## 2013-04-05 MED ORDER — HYDROCODONE-ACETAMINOPHEN 5-325 MG PO TABS: 0.5000 | ORAL_TABLET | Freq: Four times a day (QID) | ORAL | Status: DC | PRN

## 2013-04-07 MED ORDER — GEMFIBROZIL 600 MG PO TABS: 600.0000 mg | ORAL_TABLET | Freq: Two times a day (BID) | ORAL | Status: DC

## 2013-04-07 NOTE — Addendum Note (Signed)
Addended by: Milinda Antis F on: 04/07/2013 10:21 PM   Modules accepted: Orders

## 2013-04-14 ENCOUNTER — Ambulatory Visit (INDEPENDENT_AMBULATORY_CARE_PROVIDER_SITE_OTHER): Payer: Medicare Other | Admitting: Family Medicine

## 2013-04-14 ENCOUNTER — Encounter: Payer: Self-pay | Admitting: Family Medicine

## 2013-04-14 VITALS — BP 122/60 | HR 86 | Temp 99.0°F | Resp 18 | Wt 108.1 lb

## 2013-04-14 DIAGNOSIS — J069 Acute upper respiratory infection, unspecified: Secondary | ICD-10-CM

## 2013-04-14 MED ORDER — LEVOFLOXACIN 500 MG PO TABS: 500.0000 mg | ORAL_TABLET | Freq: Every day | ORAL | Status: AC

## 2013-04-14 MED ORDER — BENZONATATE 100 MG PO CAPS: 100.0000 mg | ORAL_CAPSULE | Freq: Three times a day (TID) | ORAL | Status: DC | PRN

## 2013-04-14 NOTE — Progress Notes (Signed)
  Subjective:    Patient ID: [name], female    DOB: 07-23-1951, 62 y.o.   MRN: [id number]  HPI  Patient here with cough body aches nasal congestion and fever for the past 5 days. She was visiting an elderly friend who ended up with a respiratory illness last week. She's been taking fever reducer but no other over-the-counter medication. She denies shortness of breath or wheezing. She very rarely gets sick with upper respiratory infections. Her blood sugars have been elevated since she has been sick.  Review of Systems - per above   GEN- +fatigue,+ fever, weight loss,weakness, recent illness HEENT- denies eye drainage, change in vision, nasal discharge, CVS- denies chest pain, palpitations RESP- denies SOB, +cough, wheeze ABD- denies N/V, change in stools, abd pain Neuro- denies headache, dizziness, syncope, seizure activity      Objective:   Physical Exam  GEN- NAD, alert and oriented x3, sick appearing HEENT- PERRL, EOMI, non injected sclera, pink conjunctiva, MMM, oropharynx mild injection, TM clear bilat no effusion, no maxillary sinus tenderness, nares clear  Neck- Supple, shotty LAD CVS- RRR, no murmur RESP-upper airway congestion, no rhonchi, no rales, no wheeze EXT- No edema Pulses- Radial 2+         Assessment & Plan:

## 2013-04-14 NOTE — Patient Instructions (Addendum)
Treating upper respiratory infection Take antibiotics as prescribed Use cough perrle or 1/2 tablet of your hydrocodne

## 2013-04-17 DIAGNOSIS — J069 Acute upper respiratory infection, unspecified: Secondary | ICD-10-CM | POA: Insufficient documentation

## 2013-04-17 NOTE — Assessment & Plan Note (Signed)
I think this was likley viral mediated, however symptoms persist and she is higher risk due to uncontrolled DM and other co-morbidities. Will treat with antibiotics, cough medication, push fluids.She has hydrocodone she can also use this for cough suppresant

## 2013-04-25 ENCOUNTER — Telehealth: Payer: Self-pay | Admitting: Family Medicine

## 2013-04-25 NOTE — Telephone Encounter (Signed)
Patient aware.

## 2013-04-25 NOTE — Telephone Encounter (Signed)
Have her continue fish oil 2 tablets twice a day instead

## 2013-06-09 ENCOUNTER — Other Ambulatory Visit: Payer: Self-pay

## 2013-06-09 ENCOUNTER — Telehealth: Payer: Self-pay | Admitting: Gastroenterology

## 2013-06-09 ENCOUNTER — Encounter: Payer: Self-pay | Admitting: Gastroenterology

## 2013-06-09 ENCOUNTER — Ambulatory Visit (INDEPENDENT_AMBULATORY_CARE_PROVIDER_SITE_OTHER): Payer: Medicare Other | Admitting: Gastroenterology

## 2013-06-09 ENCOUNTER — Other Ambulatory Visit: Payer: Self-pay | Admitting: Gastroenterology

## 2013-06-09 VITALS — BP 129/70 | HR 78 | Temp 98.2°F | Ht <= 58 in | Wt 108.4 lb

## 2013-06-09 DIAGNOSIS — K625 Hemorrhage of anus and rectum: Secondary | ICD-10-CM

## 2013-06-09 LAB — CBC WITH DIFFERENTIAL/PLATELET
Hemoglobin: 13.1 g/dL (ref 12.0–15.0)
Lymphocytes Relative: 35 % (ref 12–46)
Lymphs Abs: 2.9 10*3/uL (ref 0.7–4.0)
MCV: 86.5 fL (ref 78.0–100.0)
Neutrophils Relative %: 55 % (ref 43–77)
Platelets: 249 10*3/uL (ref 150–400)
RBC: 4.3 MIL/uL (ref 3.87–5.11)
WBC: 8.3 10*3/uL (ref 4.0–10.5)

## 2013-06-09 MED ORDER — PEG 3350-KCL-NA BICARB-NACL 420 G PO SOLR: 4000.0000 mL | ORAL | Status: DC

## 2013-06-09 NOTE — Telephone Encounter (Signed)
Pt called to make OV with SF ONLY. She said last night she past a lot of blood in the toilet and thinks it was mostly blood clots. This morning she is just wiping a little bit of blood on tissue paper. She was offered to see LSL on 6/30, but wants SF ONLY and i told her SF next available was mid July. I told her that I would feel better if the nurse talked with her to see what she and/or SF would advise and someone would call her back. Pt agreed [phone]

## 2013-06-09 NOTE — Patient Instructions (Addendum)
Please have blood work done today.   We have set you up for a colonoscopy and possible upper endoscopy with Dr. Darrick Penna in the near future.  Please seek emergency medical attention if you have severe rectal bleeding or abdominal pain.

## 2013-06-09 NOTE — Progress Notes (Signed)
REVIEWED.  PT NEEDS TCS/?HEMORRHOID BANDING/EGD.

## 2013-06-09 NOTE — Telephone Encounter (Signed)
Pt returned call and is going for CBC and here for 2:30 PM with AS this afternoon.

## 2013-06-09 NOTE — Telephone Encounter (Signed)
PT SEEN IN CLINIC TODAY

## 2013-06-09 NOTE — Assessment & Plan Note (Signed)
62 year old female with acute onset of rectal bleeding last night, now resolved. Described as clots, "black" at first then followed by bright red. She is in no distress at time of visit; last colonoscopy in 2009 with diverticulosis. Denies abdominal pain associated with rectal bleeding; no changes in bowel habits or any upper GI symptoms. Due to new onset rectal bleeding, I have recommend a colonoscopy +/- EGD with Dr. Darrick Penna as soon as possible. Question diverticular origin, possible internal hemorrhoids, less likely ischemic colitis. Very unlikely upper GI origin. Only takes baby aspirin daily. CBC has been ordered stat. She was counseled on signs and symptoms that would prompt urgent ED evaluation.   Proceed with colonoscopy+/- EGD  with Dr. Darrick Penna in the near future. The risks, benefits, and alternatives have been discussed in detail with the patient. They state understanding and desire to proceed.  CBC stat now

## 2013-06-09 NOTE — Progress Notes (Signed)
Cc PCP 

## 2013-06-09 NOTE — Telephone Encounter (Signed)
I called pt. She said she has regular BM's daily, 1-2. But last evening she went to the bathroom and did not have a BM, went to urinate, and dark red blood came out of her rectum, and she said she was sure it came out of her rectum. She has not had any pain. This AM she had BM and had a little bright red blood on tissue. She said she does not have a history of hemorrhoids. Please advise!

## 2013-06-09 NOTE — Telephone Encounter (Signed)
I don't see where she has been seen recently; last was in 2009, unless I am overlooking it. Colonoscopy in 2009 with frequent sigmoid diverticula. I can see her today in my 2:30 slot; I'd like her to go get a stat CBC now, so it can be reviewed at time of visit.   If she is unable to come or does not want to see anyone but Dr. Darrick Penna, please still have her obtain a CBC and present to the ED if any further large amounts of rectal bleeding.

## 2013-06-09 NOTE — Progress Notes (Signed)
Primary Care Physician:  Milinda Antis, MD Primary Gastroenterologist:  Dr. Darrick Penna   Chief Complaint  Patient presents with  . Rectal Bleeding    HPI:   [name] is a 62 year old female presenting today as a work-in secondary to acute onset rectal bleeding. Last seen by [place] in 2009, with colonoscopy at that time by Dr. Darrick Penna showing a normal TI, frequent sigmoid diverticula. EGD performed as well, listed in PSH. Last night got home at 10pm, was walking dog. Felt something like she needed to have a BM. Went to bathroom, pulled pants down, saw blood clots, looked black at first then bright red. +blood clots. In underwear looked like a large clot. Toilet full of bright red blood. This morning, paper hematochezia. As of this visit, bleeding resolved. States she has a long history of abdominal discomfort intermittently, states it "doesn't feel right" but doesn't feel like diverticulitis. However, there was no abdominal pain preceding the rectal bleeding. No rectal pain. No N/V. No GERD issues. Takes baby aspirin at night. Denies fever or chills, denies changes in bowel habits.   Past Medical History  Diagnosis Date  . Diabetes mellitus   . Hypertension   . Acute diverticulosis   . Drug allergy     To augmentin  . Osteoarthritis   . GERD (gastroesophageal reflux disease)   . Hyperlipidemia   . Depression   . Hypothyroidism     Past Surgical History  Procedure Laterality Date  . Partial hysterectomy    . Bladder surgery      Tack  . Carpal tunnel release      Bilateral  . Arm surgery      Bilateral  . Lipoma excision      From abdomen  . Tonsillectomy    . Goiter removal    . Ileocolonoscopy  04/06/2008    OZH:YQMVHQ terminal ileum, approximately 15-20 cm visualized/sigmoid colon diverticula/Normal retroflexed view of the rectum.  . Esophagogastroduodenoscopy  04/06/2008    ION:GEXBMW erosions with occasional ulceration in the distal esophagus at the GE junction.  Patent  fibrous ring/3-4 cm sliding hiatal hernia/Normal duodenal bulb ampulla in second portion of the duodenum  . Cataract extraction      bilateral    Current Outpatient Prescriptions  Medication Sig Dispense Refill  . aspirin 81 MG tablet Take 81 mg by mouth daily.      . calcium carbonate (OS-CAL) 600 MG TABS Take 600 mg by mouth 2 (two) times daily with a meal.      . Cholecalciferol (VITAMIN D) 2000 UNITS tablet Take 2,000 Units by mouth daily.      . citalopram (CELEXA) 40 MG tablet Take 0.5 tablets (20 mg total) by mouth daily.  90 tablet  1  . CRANBERRY EXTRACT PO Take 84 mg by mouth daily.      . diphenoxylate-atropine (LOMOTIL) 2.5-0.025 MG per tablet Take 1 tablet by mouth 4 (four) times daily as needed.  120 tablet  2  . HYDROcodone-acetaminophen (NORCO/VICODIN) 5-325 MG per tablet Take 0.5 tablets by mouth every 6 (six) hours as needed for pain.  60 tablet  1  . insulin aspart (NOVOLOG) 100 UNIT/ML injection Inject 36 Units into the skin 3 (three) times daily before meals.      . insulin detemir (LEVEMIR) 100 UNIT/ML injection 70units in AM and 80 units in pm      . levothyroxine (SYNTHROID, LEVOTHROID) 50 MCG tablet Take 1 tablet (50 mcg total) by mouth daily.  90 tablet  0  . lisinopril (PRINIVIL,ZESTRIL) 40 MG tablet Take 1 tablet (40 mg total) by mouth daily.  90 tablet  2  . Multiple Vitamin (MULTIVITAMIN) tablet Take 1 tablet by mouth daily.        . Omega-3 Fatty Acids (FISH OIL) 1200 MG CAPS Take 2 capsules by mouth 2 (two) times daily.        Marland Kitchen omeprazole (PRILOSEC) 20 MG capsule Take 1 capsule (20 mg total) by mouth daily.  90 capsule  2  . verapamil (CALAN) 120 MG tablet Take 1 tablet (120 mg total) by mouth 2 (two) times daily.  180 tablet  2  . vitamin C (ASCORBIC ACID) 500 MG tablet Take 500 mg by mouth daily.        Marland Kitchen zolpidem (AMBIEN) 10 MG tablet Take 1 tablet (10 mg total) by mouth at bedtime as needed.  90 tablet  2   No current facility-administered medications for  this visit.    Allergies as of 06/09/2013 - Review Complete 06/09/2013  Allergen Reaction Noted  . Meperidine hcl  08/22/2010  . Penicillins  08/22/2010    Family History  Problem Relation Age of Onset  . Hypertension Other   . Coronary artery disease Other   . Diabetes Other   . Stroke Mother   . Diabetes Mother   . Hypertension Mother   . Heart disease Father   . Hyperlipidemia Father   . Diabetes Brother   . Hypertension Brother   . Colon cancer Neg Hx     History   Social History  . Marital Status: Widowed    Spouse Name: N/A    Number of Children: N/A  . Years of Education: N/A   Occupational History  . Retired    Social History Main Topics  . Smoking status: Never Smoker   . Smokeless tobacco: Not on file  . Alcohol Use: No  . Drug Use: No  . Sexually Active: Not on file   Other Topics Concern  . Not on file   Social History Narrative  . No narrative on file    Review of Systems: Gen: SEE HPI CV: Denies chest pain, heart palpitations, peripheral edema, syncope.  Resp: Denies shortness of breath at rest or with exertion. Denies wheezing or cough.  GI: SEE HPI GU : Denies urinary burning, urinary frequency, urinary hesitancy MS: +joint pain Derm: Denies rash, itching, dry skin Psych: Denies depression, anxiety, memory loss, and confusion Heme: SEE HPI.  Physical Exam: BP 129/70  Pulse 78  Temp(Src) 98.2 F (36.8 C) (Oral)  Ht 4\' 10"  (1.473 m)  Wt 108 lb 6.4 oz (49.17 kg)  BMI 22.66 kg/m2 General:   Alert and oriented. Pleasant and cooperative. Well-nourished and well-developed.  Head:  Normocephalic and atraumatic. Eyes:  Without icterus, sclera clear and conjunctiva pink.  Ears:  Normal auditory acuity. Nose:  No deformity, discharge,  or lesions. Mouth:  Poor dentition Neck:  Supple, without mass or thyromegaly. Lungs:  Clear to auscultation bilaterally. No wheezes, rales, or rhonchi. No distress.  Heart:  S1, S2 present without  murmurs appreciated.  Abdomen:  +BS, soft, mild TTP LLQ and non-distended. No HSM noted. No guarding or rebound. Umbilical hernia noted. Rectal:  Deferred  Msk:  Symmetrical without gross deformities. Normal posture. Extremities:  Arthritic changes bilateral hands Neurologic:  Alert and  oriented x4;  grossly normal neurologically. Skin:  Intact without significant lesions or rashes. Cervical Nodes:  No significant cervical adenopathy. Psych:  Alert and cooperative. Normal mood and affect.

## 2013-06-09 NOTE — Telephone Encounter (Signed)
Called and left message on VM that we have a 2:30 PM appt with Gerrit Halls, NP, but please call immediately for more info. ( CBC faxed to [facility]).

## 2013-06-10 ENCOUNTER — Encounter (HOSPITAL_COMMUNITY): Payer: Self-pay | Admitting: Pharmacy Technician

## 2013-06-15 ENCOUNTER — Other Ambulatory Visit: Payer: Self-pay | Admitting: Gastroenterology

## 2013-06-15 DIAGNOSIS — K625 Hemorrhage of anus and rectum: Secondary | ICD-10-CM

## 2013-06-15 NOTE — Progress Notes (Signed)
DONE

## 2013-06-15 NOTE — Progress Notes (Signed)
Quick Note:  Called and informed pt. Routing to Soledad Gerlach to see if all orders are in. ______

## 2013-06-15 NOTE — Progress Notes (Signed)
Quick Note:  No anemia on CBC. Please make sure she is scheduled for possible banding at time of TCS/EGD with Dr. Darrick Penna. ______

## 2013-06-16 ENCOUNTER — Encounter (HOSPITAL_COMMUNITY): Payer: Self-pay | Admitting: Pharmacy Technician

## 2013-06-22 ENCOUNTER — Encounter (HOSPITAL_COMMUNITY)
Admission: RE | Disposition: A | Discharge status: Home / Self Care | Payer: Self-pay | Source: Ambulatory Visit | Attending: Gastroenterology

## 2013-06-22 ENCOUNTER — Ambulatory Visit (HOSPITAL_COMMUNITY): Admission: RE | Admit: 2013-06-22 | Payer: Medicare Other | Source: Ambulatory Visit | Admitting: Gastroenterology

## 2013-06-22 ENCOUNTER — Encounter (HOSPITAL_COMMUNITY): Payer: Self-pay | Admitting: *Deleted

## 2013-06-22 ENCOUNTER — Encounter (HOSPITAL_COMMUNITY): Admission: RE | Payer: Self-pay | Source: Ambulatory Visit

## 2013-06-22 ENCOUNTER — Ambulatory Visit (HOSPITAL_COMMUNITY)
Admission: RE | Admit: 2013-06-22 | Discharge: 2013-06-22 | Disposition: A | Discharge status: Home / Self Care | Payer: Medicare Other | Source: Ambulatory Visit | Attending: Gastroenterology | Admitting: Gastroenterology

## 2013-06-22 DIAGNOSIS — K5730 Diverticulosis of large intestine without perforation or abscess without bleeding: Secondary | ICD-10-CM | POA: Insufficient documentation

## 2013-06-22 DIAGNOSIS — Z01812 Encounter for preprocedural laboratory examination: Secondary | ICD-10-CM | POA: Insufficient documentation

## 2013-06-22 DIAGNOSIS — D128 Benign neoplasm of rectum: Secondary | ICD-10-CM | POA: Insufficient documentation

## 2013-06-22 DIAGNOSIS — K620 Anal polyp: Secondary | ICD-10-CM

## 2013-06-22 DIAGNOSIS — E119 Type 2 diabetes mellitus without complications: Secondary | ICD-10-CM | POA: Insufficient documentation

## 2013-06-22 DIAGNOSIS — Z794 Long term (current) use of insulin: Secondary | ICD-10-CM | POA: Insufficient documentation

## 2013-06-22 DIAGNOSIS — K625 Hemorrhage of anus and rectum: Secondary | ICD-10-CM | POA: Insufficient documentation

## 2013-06-22 DIAGNOSIS — K648 Other hemorrhoids: Secondary | ICD-10-CM | POA: Insufficient documentation

## 2013-06-22 DIAGNOSIS — K621 Rectal polyp: Secondary | ICD-10-CM

## 2013-06-22 DIAGNOSIS — I10 Essential (primary) hypertension: Secondary | ICD-10-CM | POA: Insufficient documentation

## 2013-06-22 HISTORY — PX: HEMORRHOID BANDING: SHX5850

## 2013-06-22 HISTORY — PX: COLONOSCOPY: SHX5424

## 2013-06-22 LAB — GLUCOSE, CAPILLARY: Glucose-Capillary: 296 mg/dL — ABNORMAL HIGH (ref 70–99)

## 2013-06-22 SURGERY — COLONOSCOPY
Anesthesia: Moderate Sedation

## 2013-06-22 MED ORDER — FENTANYL CITRATE 0.05 MG/ML IJ SOLN
INTRAMUSCULAR | Status: AC
  Filled 2013-06-22: qty 4

## 2013-06-22 MED ORDER — FENTANYL CITRATE 0.05 MG/ML IJ SOLN
INTRAMUSCULAR | Status: DC | PRN
  Administered 2013-06-22: 50 ug via INTRAVENOUS
  Administered 2013-06-22 (×2): 25 ug via INTRAVENOUS

## 2013-06-22 MED ORDER — MIDAZOLAM HCL 5 MG/5ML IJ SOLN
INTRAMUSCULAR | Status: DC | PRN
  Administered 2013-06-22: 1 mg via INTRAVENOUS
  Administered 2013-06-22 (×2): 2 mg via INTRAVENOUS

## 2013-06-22 MED ORDER — MIDAZOLAM HCL 5 MG/5ML IJ SOLN
INTRAMUSCULAR | Status: AC
  Filled 2013-06-22: qty 10

## 2013-06-22 MED ORDER — STERILE WATER FOR IRRIGATION IR SOLN
Status: DC | PRN
  Administered 2013-06-22: 10:00:00

## 2013-06-22 MED ORDER — SODIUM CHLORIDE 0.9 % IV SOLN
INTRAVENOUS | Status: DC
  Administered 2013-06-22: 1000 mL via INTRAVENOUS

## 2013-06-22 NOTE — H&P (Signed)
Primary Care Physician:  Milinda Antis, MD Primary Gastroenterologist:  Dr. Darrick Penna  Pre-Procedure History & Physical: HPI:  [name] is a 62 y.o. female here for MELENA/brbpr.  Past Medical History  Diagnosis Date  . Diabetes mellitus   . Hypertension   . Acute diverticulosis   . Drug allergy     To augmentin  . Osteoarthritis   . GERD (gastroesophageal reflux disease)   . Hyperlipidemia   . Depression   . Hypothyroidism     Past Surgical History  Procedure Laterality Date  . Partial hysterectomy    . Bladder surgery      Tack  . Carpal tunnel release      Bilateral  . Arm surgery      Bilateral  . Lipoma excision      From abdomen  . Tonsillectomy    . Goiter removal    . Ileocolonoscopy  04/06/2008    BJY:[id number] terminal ileum, approximately 15-20 cm visualized/sigmoid colon diverticula/Normal retroflexed view of the rectum.  . Esophagogastroduodenoscopy  04/06/2008    OZH:YQMVHQ erosions with occasional ulceration in the distal esophagus at the GE junction.  Patent fibrous ring/3-4 cm sliding hiatal hernia/Normal duodenal bulb ampulla in second portion of the duodenum  . Cataract extraction      bilateral    Prior to Admission medications   Medication Sig Start Date End Date Taking? Authorizing Provider  aspirin 81 MG tablet Take 81 mg by mouth daily.   Yes Historical Provider, MD  Cholecalciferol (VITAMIN D) 2000 UNITS tablet Take 2,000 Units by mouth daily.   Yes Historical Provider, MD  citalopram (CELEXA) 40 MG tablet Take 0.5 tablets (20 mg total) by mouth daily. 12/21/12  Yes Salley Scarlet, MD  CRANBERRY EXTRACT PO Take 84 mg by mouth daily.   Yes Historical Provider, MD  HYDROcodone-acetaminophen (NORCO/VICODIN) 5-325 MG per tablet Take 0.5 tablets by mouth every 6 (six) hours as needed for pain. 04/05/13  Yes Salley Scarlet, MD  insulin aspart (NOVOLOG) 100 UNIT/ML injection Inject 36 Units into the skin 3 (three) times daily before meals.    Yes Historical Provider, MD  insulin detemir (LEVEMIR) 100 UNIT/ML injection Inject 70-80 Units into the skin 2 (two) times daily. 70units in AM and 80 units in pm   Yes Historical Provider, MD  levothyroxine (SYNTHROID, LEVOTHROID) 75 MCG tablet Take 75 mcg by mouth at bedtime.    Yes Historical Provider, MD  lisinopril (PRINIVIL,ZESTRIL) 40 MG tablet Take 1 tablet (40 mg total) by mouth daily. 04/01/13  Yes Salley Scarlet, MD  Multiple Vitamin (MULTIVITAMIN) tablet Take 1 tablet by mouth daily.     Yes Historical Provider, MD  Omega-3 Fatty Acids (FISH OIL) 1200 MG CAPS Take 2 capsules by mouth 2 (two) times daily.     Yes Historical Provider, MD  omeprazole (PRILOSEC) 20 MG capsule Take 1 capsule (20 mg total) by mouth daily. 04/01/13  Yes Salley Scarlet, MD  verapamil (CALAN) 120 MG tablet Take 1 tablet (120 mg total) by mouth 2 (two) times daily. 04/01/13  Yes Salley Scarlet, MD  vitamin C (ASCORBIC ACID) 500 MG tablet Take 500 mg by mouth daily.     Yes Historical Provider, MD  zolpidem (AMBIEN) 10 MG tablet Take 1 tablet (10 mg total) by mouth at bedtime as needed. 04/01/13  Yes Salley Scarlet, MD  diphenoxylate-atropine (LOMOTIL) 2.5-0.025 MG per tablet Take 1 tablet by mouth 4 (four) times daily as needed. 09/28/12  Salley Scarlet, MD  polyethylene glycol-electrolytes (TRILYTE) 420 G solution Take 4,000 mLs by mouth as directed. 06/09/13   [place]ali, MD    Allergies as of 06/15/2013 - Review Complete 06/10/2013  Allergen Reaction Noted  . Meperidine hcl Nausea And Vomiting 08/22/2010  . Penicillins Hives 08/22/2010    Family History  Problem Relation Age of Onset  . Hypertension Other   . Coronary artery disease Other   . Diabetes Other   . Stroke Mother   . Diabetes Mother   . Hypertension Mother   . Heart disease Father   . Hyperlipidemia Father   . Diabetes Brother   . Hypertension Brother   . Colon cancer Neg Hx     History   Social History  . Marital Status:  Widowed    Spouse Name: N/A    Number of Children: N/A  . Years of Education: N/A   Occupational History  . Retired    Social History Main Topics  . Smoking status: Never Smoker   . Smokeless tobacco: Not on file  . Alcohol Use: No  . Drug Use: No  . Sexually Active: Not on file   Other Topics Concern  . Not on file   Social History Narrative  . No narrative on file    Review of Systems: See HPI, otherwise negative ROS   Physical Exam: BP 138/72  Pulse 73  Temp(Src) 98.5 F (36.9 C) (Oral)  Resp 18  Ht 4\' 10"  (1.473 m)  Wt 108 lb (48.988 kg)  BMI 22.58 kg/m2  SpO2 97% General:   Alert,  pleasant and cooperative in NAD Head:  Normocephalic and atraumatic. Neck:  Supple; Lungs:  Clear throughout to auscultation.    Heart:  Regular rate and rhythm. Abdomen:  Soft, nontender and nondistended. Normal bowel sounds, without guarding, and without rebound.   Neurologic:  Alert and  oriented x4;  grossly normal neurologically.  Impression/Plan:    MELENA/BRBPR  PLAN: 1. EGD/TCS TODAY

## 2013-06-22 NOTE — Op Note (Signed)
[facility] [address] [place], [address]   COLONOSCOPY PROCEDURE REPORT  PATIENT: [name], [name].  MR#: [id number] BIRTHDATE: [birth year] , 62  yrs. old GENDER: Female ENDOSCOPIST: Jonette Eva, MD REFERRED GN:[id number] Honomu, M.D. PROCEDURE DATE:  06/22/2013 PROCEDURE:   Colonoscopy with cold biopsy polypectomy and Hemorrhoidectomy via banding x3 INDICATIONS:Rectal Bleeding- PT DENIED BLACK TARRY STOOLS. MEDICATIONS: Fentanyl 100 mcg IV and Versed 5 mg IV  DESCRIPTION OF PROCEDURE:    Physical exam was performed.  Informed consent was obtained from the patient after explaining the benefits, risks, and alternatives to procedure.  The patient was connected to monitor and placed in left lateral position. Continuous oxygen was provided by nasal cannula and IV medicine administered through an indwelling cannula.  After administration of sedation and rectal exam, the patients rectum was intubated and the EC-3890Li ([id number]), EC-3490TLi ([id number]), and EG-2990i ([id number])  colonoscope was advanced under direct visualization to the ileum.  The scope was removed slowly by carefully examining the color, texture, anatomy, and integrity mucosa on the way out.  The patient was recovered in endoscopy and discharged home in satisfactory condition.    COLON FINDINGS: The mucosa appeared normal in the terminal ileum.  , Mild diverticulosis was noted in the ascending colon.  , There was moderate diverticulosis noted in the descending colon and sigmoid colon with associated muscular hypertrophy and colonic narrowing. Two sessile polyps measuring 3 mm in size were found in the rectum. A polypectomy was performed with cold forceps.  , and The colon IS redundant.  Manual abdominal counter-pressure was used to reach the cecum.  The patient was moved on to their back to reach the cecum. MODERATE INTERNAL HEMORRHOIDS. 3 BANDS APPLIED SUCCESSFULLY. PREP QUALITY: good.    CECAL  W/D TIME: 11 minutes COMPLICATIONS: None  ENDOSCOPIC IMPRESSION: 1.   RECTAL BLEEDING DUE TO INTERNAL HEMORRHOIDS. 2.   Mild diverticulosis  in the ascending colon &s moderate diverticulosis  in the descending colon and sigmoid colon 3.   Two SMALL RECTAL polyps & The colon IS redundant  RECOMMENDATIONS: CALL [phone] IF YOU HAVE A FEVER, A LARGE AMOUNT OF BLEEDING, OR DIFFICULTY URINATING.  HOLD ASPIRIN FOR 5 DAYS.  RESTART JUL 2. DRINK WATER TO KEEP URINE LIGHT YELLOW. MAY USE NAPROXEN OR IBUPROFEN TWICE DAILY FOR RECTAL DISCOMFORT. TYLENOL AS NEED FOR ADDITIONAL PAIN RELIEF. COLACE TWICE DAILY TO SOFTEN STOOL. FOLLOW A LOW RESIDUE DIET FOR THE NEXT 2-3 WEEKS. FOLLOW UP JUL 10 AT 1100.   Next colonoscopy in 10 years.       _______________________________ Rosalie DoctorJonette Eva, MD 06/22/2013 11:36 AM     PATIENT NAME:  [name] MR#: [id number]

## 2013-06-28 ENCOUNTER — Encounter (HOSPITAL_COMMUNITY): Payer: Self-pay | Admitting: Gastroenterology

## 2013-06-29 ENCOUNTER — Telehealth: Payer: Self-pay | Admitting: Gastroenterology

## 2013-06-29 NOTE — Telephone Encounter (Signed)
Please call pt. She had HYPERPLASTIC POLYPS removed from her RECTUM.    DRINK WATER TO KEEP URINE LIGHT YELLOW.  USE NAPROXEN OR IBUPROFEN TWICE DAILY FOR RECTAL DISCOMFORT. TYLENOL AS NEED FOR ADDITIONAL PAIN RELIEF. COLACE TWICE DAILY TO SOFTEN STOOL.  FOLLOW A LOW RESIDUE DIET UNTIL VISIT ON JUL 10. FOLLOW UP JUL 10 AT 1100.  Next colonoscopy in 10 years.

## 2013-06-30 NOTE — Telephone Encounter (Signed)
Cc PCP 

## 2013-06-30 NOTE — Telephone Encounter (Signed)
Pt is aware of results. 

## 2013-06-30 NOTE — Telephone Encounter (Signed)
LMOM to call back

## 2013-07-06 ENCOUNTER — Encounter: Payer: Self-pay | Admitting: Gastroenterology

## 2013-07-07 ENCOUNTER — Encounter: Payer: Self-pay | Admitting: Gastroenterology

## 2013-07-07 ENCOUNTER — Encounter: Payer: Medicare Other | Admitting: Gastroenterology

## 2013-07-07 ENCOUNTER — Encounter (INDEPENDENT_AMBULATORY_CARE_PROVIDER_SITE_OTHER): Payer: Medicare Other | Admitting: Gastroenterology

## 2013-07-07 ENCOUNTER — Ambulatory Visit (INDEPENDENT_AMBULATORY_CARE_PROVIDER_SITE_OTHER): Payer: Medicare Other | Admitting: Gastroenterology

## 2013-07-07 DIAGNOSIS — K5730 Diverticulosis of large intestine without perforation or abscess without bleeding: Secondary | ICD-10-CM

## 2013-07-07 DIAGNOSIS — K648 Other hemorrhoids: Secondary | ICD-10-CM

## 2013-07-07 DIAGNOSIS — K219 Gastro-esophageal reflux disease without esophagitis: Secondary | ICD-10-CM

## 2013-07-07 HISTORY — DX: Other hemorrhoids: K64.8

## 2013-07-07 NOTE — Patient Instructions (Addendum)
DRINK WATER TO KEEP YOUR URINE LIGHT YELLOW.  FULL LIQUID DIET FOR 3 DAYS. SEE INFO BELOW. USE TYLENOL AS NEEDED FOR ABDOMINAL PAIN. IMODIUM 1 OR 2 A DAY AS NEEDED FOR ABDOMINAL PAIN FOR THE NEXT 3 DAYS.  USE NEXIUM 30 MINS PRIOR TO SUPPER FOR THE NEXT 2 WEEKS.  WHEN ABDOMINAL PAIN RESOLVES, FOLLOW A HIGH FIBER DIET. AVOID ITEMS THAT CAUSE BLOATING & GAS.  FOLLOW UP IN 6 MOS.   Full Liquid Diet A high-calorie, high-protein supplement should be used to meet your nutritional requirements when the full liquid diet is continued for more than 2 or 3 days. If this diet is to be used for an extended period of time (more than 7 days), a multivitamin should be considered.  Breads and Starches  Allowed: None are allowed except crackersWHOLE OR pureed (made into a thick, smooth soup) in soup. Cooked, refined corn, oat, rice, rye, and wheat cereals are also allowed.   Avoid: Any others.    Potatoes/Pasta/Rice  Allowed: ANY ITEM AS A SOUP OR SMALL PLATE OF MASHED POTATOES OR RICE.       Vegetables  Allowed: Strained tomato or vegetable juice. Vegetables pureed in soup.   Avoid: Any others.    Fruit  Allowed: Any strained fruit juices and fruit drinks. Include 1 serving of citrus or vitamin C-enriched fruit juice daily.   Avoid: Any others.  Meat and Meat Substitutes  Allowed: Egg  Avoid: Any meat, fish, or fowl. All cheese.  Milk  Allowed: SOY Milk beverages, including milk shakes and instant breakfast mixes. Smooth yogurt.   Avoid: Any others. Avoid dairy products if not tolerated.    Soups and Combination Foods  Allowed: Broth, strained cream soups. Strained, broth-based soups.   Avoid: Any others.    Desserts and Sweets  Allowed: flavored gelatin, tapioca, plain LACTOSE FREE ice cream, sherbet, smooth pudding, junket, fruit ices, frozen ice pops, pudding pops,, frozen fudge pops, chocolate syrup. Sugar, honey, jelly, syrup.   Avoid: Any others.  Fats and  Oils  Allowed: Margarine, butter, cream, sour cream, oils.   Avoid: Any others.  Beverages  Allowed: All.   Avoid: None.  Condiments  Allowed: Iodized salt, pepper, spices, flavorings. Cocoa powder.   Avoid: Any others.    SAMPLE MEAL PLAN Breakfast   cup orange juice.   1 cup cooked wheat cereal.   1 cup SOY milk.   1 cup beverage (coffee or tea).   Cream or sugar, if desired.    Midmorning Snack  2 SCRAMBLED OR HARD BOILED EGG   Lunch  1 cup cream soup.    cup fruit juice.   1 cup SOY milk.    cup custard.   1 cup beverage (coffee or tea).   Cream or sugar, if desired.    Midafternoon Snack  1 cup VANILLA SOY milk shake.  Dinner  1 cup cream soup.    cup fruit juice.   1 cup SOY milk.    cup pudding.   1 cup beverage (coffee or tea).   Cream or sugar, if desired.  Evening Snack  1 cup supplement.  To increase calories, add sugar, cream, butter, or margarine if possible. Nutritional supplements will also increase the total calories.

## 2013-07-07 NOTE — Progress Notes (Signed)
Reminder in epic °

## 2013-07-07 NOTE — Progress Notes (Signed)
  Subjective:    Patient ID: [name], female    DOB: 09/15/1951, 62 y.o.   MRN: [id number] Milinda Antis, MD   A user error has taken place: encounter opened in error, closed for administrative reasons.  HPI    Review of Systems     Objective:   Physical Exam        Assessment & Plan:

## 2013-07-07 NOTE — Assessment & Plan Note (Signed)
SX NOT IDEALLY CONTROLLED.  INCREASE PPI TO BID FOR 2 WEEKS. OPV IN 6 MOS.

## 2013-07-07 NOTE — Assessment & Plan Note (Signed)
SX RESOLVED.  DRINK WATER TO KEEP URINE LIGHT YELLOW. EAT FIBER. OPV IN 6 MOS.

## 2013-07-07 NOTE — Progress Notes (Signed)
CC PCP 

## 2013-07-07 NOTE — Assessment & Plan Note (Signed)
WITH INTERMITTENT ABDOMINAL PAIN. CURRENTLY HAVING A FLARE OF FUNCTIONAL ABDOMINAL PAIN.  FULL LIQUID DIET FOR 3 DAYS.HO GIVEN. TYLENOL PRN IMODIUM 1 OR 2 A DAY FOR ABDOMINAL PAIN OPV IN 6 MOS

## 2013-07-07 NOTE — Progress Notes (Signed)
Subjective:    Patient ID: [name], female    DOB: 02-20-1951, 62 y.o.   MRN: [id number]  Milinda Antis, MD  HPI C/o stomach pain for on and off 5 years since we did the first colonoscopy. PAIN IN TOP AND BOTTOM OF STOMACH. NO KNOWN TRIGGERS. UNDER BELLY AND BAND UNDER HER RIBS. UPPER: SQUEZZING, BOTTOM: TWISTING, GOES BACK AND FORTH FROM LEFT TO RIGHT. NO BETTER WITH BMs THIS AM. ITS STARTED YESTERDAY AND USUALLY 2 DAYS. NO REAL PATTERN TO FREQUENCY. NO CHANGE IN STRESS IN LIFE. 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 IN AUG GOING BY TRAIN WITH HER DOG. NOW QUALIFIED FOR DISABLED RATES.   NO RECTAL BLEEDING, PRESSURE, PAIN, ITCHING, BURNING, SOILING.  Past Medical History  Diagnosis Date  . Diabetes mellitus   . Hypertension   . Acute diverticulosis   . Drug allergy     To augmentin  . Osteoarthritis   . GERD (gastroesophageal reflux disease)   . Hyperlipidemia   . Depression   . Hypothyroidism    Past Surgical History  Procedure Laterality Date  . Partial hysterectomy    . Bladder surgery      Tack  . Carpal tunnel release      Bilateral  . Arm surgery      Bilateral  . Lipoma excision      From abdomen  . Tonsillectomy    . Goiter removal    . Ileocolonoscopy  04/06/2008    WUJ:WJXBJY terminal ileum, approximately 15-20 cm visualized/sigmoid colon diverticula/Normal retroflexed view of the rectum.  . Esophagogastroduodenoscopy  04/06/2008    NWG:NFAOZH erosions with occasional ulceration in the distal esophagus at the GE junction.  Patent fibrous ring/3-4 cm sliding hiatal hernia/Normal duodenal bulb ampulla in second portion of the duodenum  . Cataract extraction      bilateral  . Colonoscopy N/A 06/22/2013    YQM:VHQIONGE HEMORRHOIDS/Mild diverticulosis  in the ascending colon &s moderate diverticulosis  in the descending colon and sigmoid colon/Two SMALL RECTAL polyps & The colon IS redundant  . Hemorrhoid banding N/A 06/22/2013    SLF: BANDS  APPLIED SUCCESSFULLY    Allergies  Allergen Reactions  . Meperidine Hcl Nausea And Vomiting  . Penicillins Hives    Current Outpatient Prescriptions  Medication Sig Dispense Refill  . Cholecalciferol (VITAMIN D) 2000 UNITS tablet Take 2,000 Units by mouth daily.      . citalopram (CELEXA) 40 MG tablet Take 0.5 tablets (20 mg total) by mouth daily.    Marland Kitchen CRANBERRY EXTRACT PO Take 84 mg by mouth daily.    . diphenoxylate-atropine (LOMOTIL) 2.5-0.025 MG per tablet Take 1 tablet by mouth 4 (four) times daily as needed.    Marland Kitchen HYDROcodone-acetaminophen (NORCO/VICODIN) 5-325 MG per tablet Take 0.5 tablets by mouth every 6 (six) hours as needed for pain.    Marland Kitchen insulin aspart (NOVOLOG) 100 UNIT/ML injection Inject 36 Units into the skin 3 (three) times daily before meals.    . insulin detemir (LEVEMIR) 100 UNIT/ML injection Inject 70-80 Units into the skin 2 (two) times daily. 70units in AM and 80 units in pm    . levothyroxine (SYNTHROID, LEVOTHROID) 75 MCG tablet Take 75 mcg by mouth at bedtime.     Marland Kitchen lisinopril (PRINIVIL,ZESTRIL) 40 MG tablet Take 1 tablet (40 mg total) by mouth daily.    . Multiple Vitamin (MULTIVITAMIN) tablet Take 1 tablet by mouth daily.      . Omega-3 Fatty Acids (FISH OIL) 1200 MG  CAPS Take 2 capsules by mouth 2 (two) times daily.      Marland Kitchen omeprazole (PRILOSEC) 20 MG capsule Take 1 capsule (20 mg total) by mouth daily.    . verapamil (CALAN) 120 MG tablet Take 1 tablet (120 mg total) by mouth 2 (two) times daily.    . vitamin C (ASCORBIC ACID) 500 MG tablet Take 500 mg by mouth daily.      Marland Kitchen zolpidem (AMBIEN) 10 MG tablet Take 1 tablet (10 mg total) by mouth at bedtime as needed.    .           Review of Systems     Objective:   Physical Exam  Vitals reviewed. Constitutional: She is oriented to person, place, and time. She appears well-nourished. No distress.  HENT:  Head: Normocephalic and atraumatic.  Mouth/Throat: Oropharynx is clear and moist. No oropharyngeal  exudate.  Eyes: Pupils are equal, round, and reactive to light. No scleral icterus.  Neck: Normal range of motion. Neck supple.  Cardiovascular: Normal rate, regular rhythm and normal heart sounds.   Pulmonary/Chest: Effort normal and breath sounds normal. No respiratory distress.  Abdominal: Soft. Bowel sounds are normal. She exhibits no distension. There is tenderness. There is no rebound and no guarding.  MILD TTP IN THE EPIGASTRIUM  MILD BLQs TTP   Musculoskeletal: She exhibits no edema.  Lymphadenopathy:    She has no cervical adenopathy.  Neurological: She is alert and oriented to person, place, and time.  NO FOCAL DEFICITS   Psychiatric: She has a normal mood and affect.          Assessment & Plan:

## 2013-08-24 ENCOUNTER — Inpatient Hospital Stay (HOSPITAL_COMMUNITY)
Admission: EM | Admit: 2013-08-24 | Discharge: 2013-08-30 | DRG: 392 | Disposition: A | Discharge status: Home / Self Care | Payer: Medicare Other | Attending: Internal Medicine | Admitting: Internal Medicine

## 2013-08-24 ENCOUNTER — Emergency Department (HOSPITAL_COMMUNITY): Payer: Medicare Other

## 2013-08-24 ENCOUNTER — Encounter (HOSPITAL_COMMUNITY): Payer: Self-pay | Admitting: Emergency Medicine

## 2013-08-24 DIAGNOSIS — K5730 Diverticulosis of large intestine without perforation or abscess without bleeding: Secondary | ICD-10-CM | POA: Diagnosis present

## 2013-08-24 DIAGNOSIS — Z90710 Acquired absence of both cervix and uterus: Secondary | ICD-10-CM

## 2013-08-24 DIAGNOSIS — K5732 Diverticulitis of large intestine without perforation or abscess without bleeding: Principal | ICD-10-CM | POA: Diagnosis present

## 2013-08-24 DIAGNOSIS — E871 Hypo-osmolality and hyponatremia: Secondary | ICD-10-CM | POA: Diagnosis present

## 2013-08-24 DIAGNOSIS — M1990 Unspecified osteoarthritis, unspecified site: Secondary | ICD-10-CM | POA: Diagnosis present

## 2013-08-24 DIAGNOSIS — Z79899 Other long term (current) drug therapy: Secondary | ICD-10-CM

## 2013-08-24 DIAGNOSIS — D72829 Elevated white blood cell count, unspecified: Secondary | ICD-10-CM | POA: Diagnosis present

## 2013-08-24 DIAGNOSIS — R110 Nausea: Secondary | ICD-10-CM | POA: Diagnosis present

## 2013-08-24 DIAGNOSIS — IMO0001 Reserved for inherently not codable concepts without codable children: Secondary | ICD-10-CM

## 2013-08-24 DIAGNOSIS — M549 Dorsalgia, unspecified: Secondary | ICD-10-CM | POA: Diagnosis present

## 2013-08-24 DIAGNOSIS — K5792 Diverticulitis of intestine, part unspecified, without perforation or abscess without bleeding: Secondary | ICD-10-CM | POA: Diagnosis present

## 2013-08-24 DIAGNOSIS — E039 Hypothyroidism, unspecified: Secondary | ICD-10-CM | POA: Diagnosis present

## 2013-08-24 DIAGNOSIS — K219 Gastro-esophageal reflux disease without esophagitis: Secondary | ICD-10-CM | POA: Diagnosis present

## 2013-08-24 DIAGNOSIS — F3289 Other specified depressive episodes: Secondary | ICD-10-CM | POA: Diagnosis present

## 2013-08-24 DIAGNOSIS — I10 Essential (primary) hypertension: Secondary | ICD-10-CM | POA: Diagnosis present

## 2013-08-24 DIAGNOSIS — F329 Major depressive disorder, single episode, unspecified: Secondary | ICD-10-CM | POA: Diagnosis present

## 2013-08-24 DIAGNOSIS — E119 Type 2 diabetes mellitus without complications: Secondary | ICD-10-CM | POA: Diagnosis present

## 2013-08-24 DIAGNOSIS — E785 Hyperlipidemia, unspecified: Secondary | ICD-10-CM | POA: Diagnosis present

## 2013-08-24 DIAGNOSIS — Z794 Long term (current) use of insulin: Secondary | ICD-10-CM

## 2013-08-24 DIAGNOSIS — F32A Depression, unspecified: Secondary | ICD-10-CM | POA: Diagnosis present

## 2013-08-24 LAB — BASIC METABOLIC PANEL
CO2: 27 mEq/L (ref 19–32)
Calcium: 9.8 mg/dL (ref 8.4–10.5)
Creatinine, Ser: 1.06 mg/dL (ref 0.50–1.10)

## 2013-08-24 LAB — URINALYSIS, ROUTINE W REFLEX MICROSCOPIC
Ketones, ur: NEGATIVE mg/dL
Leukocytes, UA: NEGATIVE
Nitrite: NEGATIVE
Protein, ur: NEGATIVE mg/dL
pH: 6 (ref 5.0–8.0)

## 2013-08-24 LAB — CBC
HCT: 33.8 % — ABNORMAL LOW (ref 36.0–46.0)
Hemoglobin: 11.8 g/dL — ABNORMAL LOW (ref 12.0–15.0)
MCHC: 34.9 g/dL (ref 30.0–36.0)
RBC: 3.89 MIL/uL (ref 3.87–5.11)
WBC: 12.5 10*3/uL — ABNORMAL HIGH (ref 4.0–10.5)

## 2013-08-24 LAB — CBC WITH DIFFERENTIAL/PLATELET
Basophils Absolute: 0 10*3/uL (ref 0.0–0.1)
Eosinophils Absolute: 0.3 10*3/uL (ref 0.0–0.7)
Eosinophils Relative: 2 % (ref 0–5)
Lymphocytes Relative: 21 % (ref 12–46)
MCV: 86.7 fL (ref 78.0–100.0)
Platelets: 193 10*3/uL (ref 150–400)
RDW: 12.8 % (ref 11.5–15.5)
WBC: 13.5 10*3/uL — ABNORMAL HIGH (ref 4.0–10.5)

## 2013-08-24 LAB — GLUCOSE, CAPILLARY
Glucose-Capillary: 217 mg/dL — ABNORMAL HIGH (ref 70–99)
Glucose-Capillary: 273 mg/dL — ABNORMAL HIGH (ref 70–99)

## 2013-08-24 MED ORDER — IOHEXOL 300 MG/ML  SOLN
50.0000 mL | Freq: Once | INTRAMUSCULAR | Status: AC | PRN
  Administered 2013-08-24: 50 mL via ORAL

## 2013-08-24 MED ORDER — INSULIN ASPART 100 UNIT/ML ~~LOC~~ SOLN
0.0000 [IU] | Freq: Three times a day (TID) | SUBCUTANEOUS | Status: DC
  Administered 2013-08-24: 5 [IU] via SUBCUTANEOUS
  Administered 2013-08-24: 3 [IU] via SUBCUTANEOUS
  Administered 2013-08-25: 5 [IU] via SUBCUTANEOUS
  Administered 2013-08-25: 3 [IU] via SUBCUTANEOUS
  Administered 2013-08-25 – 2013-08-27 (×5): 5 [IU] via SUBCUTANEOUS
  Administered 2013-08-27 – 2013-08-28 (×3): 3 [IU] via SUBCUTANEOUS
  Administered 2013-08-28: 8 [IU] via SUBCUTANEOUS
  Administered 2013-08-28: 3 [IU] via SUBCUTANEOUS
  Administered 2013-08-29 – 2013-08-30 (×4): 5 [IU] via SUBCUTANEOUS

## 2013-08-24 MED ORDER — ENOXAPARIN SODIUM 40 MG/0.4ML ~~LOC~~ SOLN
40.0000 mg | Freq: Every day | SUBCUTANEOUS | Status: DC
  Administered 2013-08-25 – 2013-08-30 (×6): 40 mg via SUBCUTANEOUS
  Filled 2013-08-24 (×6): qty 0.4

## 2013-08-24 MED ORDER — ACETAMINOPHEN 650 MG RE SUPP: 650.0000 mg | Freq: Four times a day (QID) | RECTAL | Status: DC | PRN

## 2013-08-24 MED ORDER — LISINOPRIL 10 MG PO TABS
40.0000 mg | ORAL_TABLET | Freq: Every day | ORAL | Status: DC
  Administered 2013-08-24 – 2013-08-30 (×7): 40 mg via ORAL
  Filled 2013-08-24 (×7): qty 4

## 2013-08-24 MED ORDER — SODIUM CHLORIDE 0.9 % IV SOLN
INTRAVENOUS | Status: AC
  Administered 2013-08-24: 13:00:00 via INTRAVENOUS

## 2013-08-24 MED ORDER — METRONIDAZOLE IN NACL 5-0.79 MG/ML-% IV SOLN
500.0000 mg | Freq: Three times a day (TID) | INTRAVENOUS | Status: DC
  Administered 2013-08-24 – 2013-08-29 (×15): 500 mg via INTRAVENOUS
  Filled 2013-08-24 (×15): qty 100

## 2013-08-24 MED ORDER — ONDANSETRON HCL 4 MG/2ML IJ SOLN
4.0000 mg | Freq: Once | INTRAMUSCULAR | Status: AC
  Administered 2013-08-24: 4 mg via INTRAVENOUS
  Filled 2013-08-24: qty 2

## 2013-08-24 MED ORDER — CITALOPRAM HYDROBROMIDE 20 MG PO TABS
20.0000 mg | ORAL_TABLET | Freq: Every day | ORAL | Status: DC
  Administered 2013-08-24 – 2013-08-30 (×7): 20 mg via ORAL
  Filled 2013-08-24 (×8): qty 1

## 2013-08-24 MED ORDER — ONDANSETRON HCL 4 MG PO TABS: 4.0000 mg | ORAL_TABLET | Freq: Four times a day (QID) | ORAL | Status: DC | PRN

## 2013-08-24 MED ORDER — ACETAMINOPHEN 325 MG PO TABS
650.0000 mg | ORAL_TABLET | Freq: Four times a day (QID) | ORAL | Status: DC | PRN
  Administered 2013-08-25: 650 mg via ORAL
  Filled 2013-08-24: qty 2

## 2013-08-24 MED ORDER — ALUM & MAG HYDROXIDE-SIMETH 200-200-20 MG/5ML PO SUSP: 30.0000 mL | Freq: Four times a day (QID) | ORAL | Status: DC | PRN

## 2013-08-24 MED ORDER — MORPHINE SULFATE 4 MG/ML IJ SOLN
4.0000 mg | Freq: Once | INTRAMUSCULAR | Status: AC
  Administered 2013-08-24: 4 mg via INTRAVENOUS
  Filled 2013-08-24: qty 1

## 2013-08-24 MED ORDER — SODIUM CHLORIDE 0.9 % IV SOLN
INTRAVENOUS | Status: DC
  Administered 2013-08-24 – 2013-08-30 (×9): via INTRAVENOUS

## 2013-08-24 MED ORDER — METRONIDAZOLE IN NACL 5-0.79 MG/ML-% IV SOLN
500.0000 mg | Freq: Once | INTRAVENOUS | Status: AC
  Administered 2013-08-24: 500 mg via INTRAVENOUS
  Filled 2013-08-24 (×2): qty 100

## 2013-08-24 MED ORDER — CIPROFLOXACIN IN D5W 400 MG/200ML IV SOLN
400.0000 mg | Freq: Once | INTRAVENOUS | Status: AC
  Administered 2013-08-24: 400 mg via INTRAVENOUS
  Filled 2013-08-24: qty 200

## 2013-08-24 MED ORDER — METRONIDAZOLE IN NACL 5-0.79 MG/ML-% IV SOLN: 500.0000 mg | Freq: Once | INTRAVENOUS | Status: DC

## 2013-08-24 MED ORDER — SODIUM CHLORIDE 0.9 % IV BOLUS (SEPSIS)
500.0000 mL | Freq: Once | INTRAVENOUS | Status: AC
  Administered 2013-08-24: 500 mL via INTRAVENOUS

## 2013-08-24 MED ORDER — CIPROFLOXACIN IN D5W 400 MG/200ML IV SOLN: 400.0000 mg | Freq: Once | INTRAVENOUS | Status: DC

## 2013-08-24 MED ORDER — HYDROMORPHONE HCL PF 1 MG/ML IJ SOLN
0.5000 mg | INTRAMUSCULAR | Status: AC | PRN
  Administered 2013-08-24 (×2): 0.5 mg via INTRAVENOUS
  Filled 2013-08-24 (×2): qty 1

## 2013-08-24 MED ORDER — INSULIN DETEMIR 100 UNIT/ML ~~LOC~~ SOLN
40.0000 [IU] | Freq: Every day | SUBCUTANEOUS | Status: DC
  Administered 2013-08-24 – 2013-08-29 (×6): 40 [IU] via SUBCUTANEOUS
  Filled 2013-08-24 (×6): qty 0.4

## 2013-08-24 MED ORDER — ZOLPIDEM TARTRATE 5 MG PO TABS
5.0000 mg | ORAL_TABLET | Freq: Every evening | ORAL | Status: DC | PRN
  Administered 2013-08-25: 5 mg via ORAL
  Filled 2013-08-24: qty 1

## 2013-08-24 MED ORDER — VERAPAMIL HCL 120 MG PO TABS
120.0000 mg | ORAL_TABLET | Freq: Two times a day (BID) | ORAL | Status: DC
  Administered 2013-08-24 – 2013-08-30 (×13): 120 mg via ORAL
  Filled 2013-08-24 (×13): qty 1

## 2013-08-24 MED ORDER — CIPROFLOXACIN IN D5W 400 MG/200ML IV SOLN
400.0000 mg | Freq: Two times a day (BID) | INTRAVENOUS | Status: DC
  Administered 2013-08-24 – 2013-08-29 (×10): 400 mg via INTRAVENOUS
  Filled 2013-08-24 (×10): qty 200

## 2013-08-24 MED ORDER — ONDANSETRON HCL 4 MG/2ML IJ SOLN
4.0000 mg | Freq: Four times a day (QID) | INTRAMUSCULAR | Status: DC | PRN
  Administered 2013-08-24 – 2013-08-29 (×16): 4 mg via INTRAVENOUS
  Filled 2013-08-24 (×16): qty 2

## 2013-08-24 MED ORDER — IOHEXOL 300 MG/ML  SOLN
100.0000 mL | Freq: Once | INTRAMUSCULAR | Status: AC | PRN
  Administered 2013-08-24: 100 mL via INTRAVENOUS

## 2013-08-24 NOTE — ED Provider Notes (Signed)
CT scan reveals sigmoid diverticulitis. Admit to general medicine  Donnetta Hutching, MD 08/24/13 1055

## 2013-08-24 NOTE — ED Provider Notes (Signed)
CSN: [id number]     Arrival date & time 08/24/13  0507 History   First MD Initiated Contact with Patient 08/24/13 0522     Chief Complaint  Patient presents with  . Abdominal Pain    Patient is a 62 y.o. female presenting with abdominal pain. The history is provided by the patient.  Abdominal Pain Pain location:  LLQ Pain quality: cramping   Pain severity:  Moderate Onset quality:  Gradual Duration:  3 days Timing:  Intermittent Progression:  Worsening Relieved by:  Nothing Worsened by:  Palpation Associated symptoms: fever and nausea   Associated symptoms: no chest pain, no cough, no diarrhea, no dysuria, no hematochezia and no vomiting     Past Medical History  Diagnosis Date  . Diabetes mellitus   . Hypertension   . Acute diverticulosis   . Drug allergy     To augmentin  . Osteoarthritis   . GERD (gastroesophageal reflux disease)   . Hyperlipidemia   . Depression   . Hypothyroidism   . Internal hemorrhoids with other complication 07/07/2013   Past Surgical History  Procedure Laterality Date  . Partial hysterectomy    . Bladder surgery      Tack  . Carpal tunnel release      Bilateral  . Arm surgery      Bilateral  . Lipoma excision      From abdomen  . Tonsillectomy    . Goiter removal    . Ileocolonoscopy  04/06/2008    JXB:JYNWGN terminal ileum, approximately 15-20 cm visualized/sigmoid colon diverticula/Normal retroflexed view of the rectum.  . Esophagogastroduodenoscopy  04/06/2008    FAO:ZHYQMV erosions with occasional ulceration in the distal esophagus at the GE junction.  Patent fibrous ring/3-4 cm sliding hiatal hernia/Normal duodenal bulb ampulla in second portion of the duodenum  . Cataract extraction      bilateral  . Colonoscopy N/A 06/22/2013    HQI:ONGEXBMW HEMORRHOIDS/Mild diverticulosis  in the ascending colon &s moderate diverticulosis  in the descending colon and sigmoid colon/Two SMALL RECTAL polyps & The colon IS redundant  . Hemorrhoid  banding N/A 06/22/2013    SLF: BANDS APPLIED SUCCESSFULLY   Family History  Problem Relation Age of Onset  . Hypertension Other   . Coronary artery disease Other   . Diabetes Other   . Stroke Mother   . Diabetes Mother   . Hypertension Mother   . Heart disease Father   . Hyperlipidemia Father   . Diabetes Brother   . Hypertension Brother   . Colon cancer Neg Hx    History  Substance Use Topics  . Smoking status: Never Smoker   . Smokeless tobacco: Not on file  . Alcohol Use: No   OB History   Grav Para Term Preterm Abortions TAB SAB Ect Mult Living                 Review of Systems  Constitutional: Positive for fever.  Respiratory: Negative for cough.   Cardiovascular: Negative for chest pain.  Gastrointestinal: Positive for nausea and abdominal pain. Negative for vomiting, diarrhea and hematochezia.  Genitourinary: Negative for dysuria.  All other systems reviewed and are negative.    Allergies  Meperidine hcl and Penicillins  Home Medications   Current Outpatient Rx  Name  Route  Sig  Dispense  Refill  . Cholecalciferol (VITAMIN D) 2000 UNITS tablet   Oral   Take 2,000 Units by mouth daily.         Marland Kitchen  citalopram (CELEXA) 40 MG tablet   Oral   Take 0.5 tablets (20 mg total) by mouth daily.   90 tablet   1   . CRANBERRY EXTRACT PO   Oral   Take 84 mg by mouth daily.         Marland Kitchen HYDROcodone-acetaminophen (NORCO/VICODIN) 5-325 MG per tablet   Oral   Take 0.5 tablets by mouth every 6 (six) hours as needed for pain.   60 tablet   1   . insulin aspart (NOVOLOG) 100 UNIT/ML injection   Subcutaneous   Inject 36 Units into the skin 3 (three) times daily before meals.         . insulin detemir (LEVEMIR) 100 UNIT/ML injection   Subcutaneous   Inject 70-80 Units into the skin 2 (two) times daily. 70units in AM and 80 units in pm         . levothyroxine (SYNTHROID, LEVOTHROID) 75 MCG tablet   Oral   Take 75 mcg by mouth at bedtime.          Marland Kitchen  lisinopril (PRINIVIL,ZESTRIL) 40 MG tablet   Oral   Take 1 tablet (40 mg total) by mouth daily.   90 tablet   2   . Multiple Vitamin (MULTIVITAMIN) tablet   Oral   Take 1 tablet by mouth daily.           . Omega-3 Fatty Acids (FISH OIL) 1200 MG CAPS   Oral   Take 2 capsules by mouth 2 (two) times daily.           Marland Kitchen omeprazole (PRILOSEC) 20 MG capsule   Oral   Take 1 capsule (20 mg total) by mouth daily.   90 capsule   2   . verapamil (CALAN) 120 MG tablet   Oral   Take 1 tablet (120 mg total) by mouth 2 (two) times daily.   180 tablet   2   . vitamin C (ASCORBIC ACID) 500 MG tablet   Oral   Take 500 mg by mouth daily.           Marland Kitchen zolpidem (AMBIEN) 10 MG tablet   Oral   Take 1 tablet (10 mg total) by mouth at bedtime as needed.   90 tablet   2   . diphenoxylate-atropine (LOMOTIL) 2.5-0.025 MG per tablet   Oral   Take 1 tablet by mouth 4 (four) times daily as needed.   120 tablet   2   . polyethylene glycol-electrolytes (TRILYTE) 420 G solution   Oral   Take 4,000 mLs by mouth as directed.   4000 mL   0    BP 164/80  Pulse 97  Temp(Src) 99.3 F (37.4 C) (Oral)  Resp 18  SpO2 98% Physical Exam CONSTITUTIONAL: Well developed/well nourished HEAD: Normocephalic/atraumatic EYES: EOMI/PERRL ENMT: Mucous membranes moist NECK: supple no meningeal signs SPINE:entire spine nontender CV: S1/S2 noted, no murmurs/rubs/gallops noted LUNGS: Lungs are clear to auscultation bilaterally, no apparent distress ABDOMEN: soft, moderate tenderness to LLQ, no rebound or guarding GU:no cva tenderness NEURO: Pt is awake/alert, moves all extremitiesx4 EXTREMITIES: pulses normal, full ROM SKIN: warm, color normal PSYCH: mildly anxious  ED Course  Procedures (including critical care time) Labs Review Labs Reviewed  BASIC METABOLIC PANEL  CBC WITH DIFFERENTIAL  URINALYSIS, ROUTINE W REFLEX MICROSCOPIC  LACTIC ACID, PLASMA   Imaging Review No results  found.  5:39 AM Pt with h/o diverticulitis (managed non-operatively) presents with LLQ pain that she feels is similar  to prior episodes Pt currently stable Labs ordered.  Will follow closely 6:25 AM Pt with leukocytosis with continued LLQ tenderness, will obtain CT imaging At signout, plan is to f/u on CT imaging.  Given high concern for diverticulitis, will go ahead and start IV cipro/flagyll   MDM  No diagnosis found. Nursing notes including past medical history and social history reviewed and considered in documentation Labs/vital reviewed and considered Previous records reviewed and considered - h/o diverticulitis per chart     Joya Gaskins, MD 08/24/13 [phone]

## 2013-08-24 NOTE — Progress Notes (Signed)
UR Chart Review Completed  

## 2013-08-24 NOTE — ED Notes (Signed)
Pt reports having "lower left quadrant pain since Sunday." Pt reports fever of 100.0 since this am and nausea since yesterday. Pt denies taking any medication for fever and last dose of pain medication at 1300 hydrocodone 5/325 tab. Pt denies vomiting and diarrhea.

## 2013-08-24 NOTE — H&P (Signed)
[facility] History and Physical  [name]:[id number] DOB: 23-Apr-1951 DOA: 08/24/2013  Referring physician:  PCP: Avon Gully, MD  Specialists:   Chief Complaint: Abdominal pain with nausea  HPI: [name] is a 62 y.o. female with a past medical history that includes diabetes, hypertension, GERD, diverticular disease, presents to the emergency room with a chief complaint of left lower quadrant pain and persistent nausea. Information is obtained from the patient. She reports a three-day history of left lower quadrant abdominal pain accompanied by nausea. She describes the pain as sharp constant nonradiating. Associated symptoms include mild anorexia for one day, one episode of loose stool without bright red blood or melena and a temperature this morning of 100. She indicates she has a history of same and waited to come to the hospital because she thought this would resolve on its own as it usually does. However this morning when the pain was not improved and and her nausea was persisting to the point she was unable to keep anything down she came to the hospital. Lab work in the emergency room significant for sodium of 133 glucose 246 white count 13.5. CT of the abdomen yieldsDiverticulitis involving the distal descending/proximal sigmoid colon. No evidence of abscess or perforation. Hepatomegaly. In addition the patient complains of low back pain that radiates to her left hip down her left thigh. This pain is chronic and she had an appointment with Dr. [name] this afternoon. He indicates that this pain is worse since her train trip to [place] recently. She denies any numbness or tingling of her lower extremities. She denies any weakness of her lower extremities or difficulty walking. She denies any incontinence of urine or bowel. We are asked to admit    Review of Systems: The patient denies  weight loss,, vision loss, decreased hearing, hoarseness, chest  pain, syncope, dyspnea on exertion, peripheral edema, balance deficits, hemoptysis,  melena, hematochezia, severe indigestion/heartburn, hematuria, incontinence, genital sores, muscle weakness, suspicious skin lesions, transient blindness, difficulty walking, depression, unusual weight change, abnormal bleeding, enlarged lymph nodes, angioedema, and breast masses.    Past Medical History  Diagnosis Date  . Diabetes mellitus   . Hypertension   . Acute diverticulosis   . Drug allergy     To augmentin  . Osteoarthritis   . GERD (gastroesophageal reflux disease)   . Hyperlipidemia   . Depression   . Hypothyroidism   . Internal hemorrhoids with other complication 07/07/2013   Past Surgical History  Procedure Laterality Date  . Partial hysterectomy    . Bladder surgery      Tack  . Carpal tunnel release      Bilateral  . Arm surgery      Bilateral  . Lipoma excision      From abdomen  . Tonsillectomy    . Goiter removal    . Ileocolonoscopy  04/06/2008    WJX:BJYNWG terminal ileum, approximately 15-20 cm visualized/sigmoid colon diverticula/Normal retroflexed view of the rectum.  . Esophagogastroduodenoscopy  04/06/2008    NFA:OZHYQM erosions with occasional ulceration in the distal esophagus at the GE junction.  Patent fibrous ring/3-4 cm sliding hiatal hernia/Normal duodenal bulb ampulla in second portion of the duodenum  . Cataract extraction      bilateral  . Colonoscopy N/A 06/22/2013    VHQ:IONGEXBM HEMORRHOIDS/Mild diverticulosis  in the ascending colon &s moderate diverticulosis  in the descending colon and sigmoid colon/Two SMALL RECTAL polyps & The colon IS redundant  . Hemorrhoid banding N/A  06/22/2013    SLF: BANDS APPLIED SUCCESSFULLY   Social History:  reports that she has never smoked. She does not have any smokeless tobacco history on file. She reports that she does not drink alcohol or use illicit drugs. Patient lives alone is independent with ADLs  Allergies   Allergen Reactions  . Meperidine Hcl Nausea And Vomiting  . Penicillins Hives    Family History  Problem Relation Age of Onset  . Hypertension Other   . Coronary artery disease Other   . Diabetes Other   . Stroke Mother   . Diabetes Mother   . Hypertension Mother   . Heart disease Father   . Hyperlipidemia Father   . Diabetes Brother   . Hypertension Brother   . Colon cancer Neg Hx      Prior to Admission medications   Medication Sig Start Date End Date Taking? Authorizing Provider  Cholecalciferol (VITAMIN D) 2000 UNITS tablet Take 2,000 Units by mouth daily.   Yes Historical Provider, MD  citalopram (CELEXA) 40 MG tablet Take 0.5 tablets (20 mg total) by mouth daily. 12/21/12  Yes Salley Scarlet, MD  CRANBERRY EXTRACT PO Take 84 mg by mouth 2 (two) times daily.    Yes Historical Provider, MD  diphenoxylate-atropine (LOMOTIL) 2.5-0.025 MG per tablet Take 1 tablet by mouth 4 (four) times daily as needed. 09/28/12  Yes Salley Scarlet, MD  HYDROcodone-acetaminophen (NORCO/VICODIN) 5-325 MG per tablet Take 0.5 tablets by mouth every 6 (six) hours as needed for pain. 04/05/13  Yes Salley Scarlet, MD  insulin aspart (NOVOLOG) 100 UNIT/ML injection Inject 36 Units into the skin 3 (three) times daily before meals. Based on sliding scale   Yes Historical Provider, MD  insulin detemir (LEVEMIR) 100 UNIT/ML injection Inject 70-80 Units into the skin 2 (two) times daily. 70units in AM and 80 units in pm   Yes Historical Provider, MD  levothyroxine (SYNTHROID, LEVOTHROID) 75 MCG tablet Take 75 mcg by mouth at bedtime.    Yes Historical Provider, MD  lisinopril (PRINIVIL,ZESTRIL) 40 MG tablet Take 1 tablet (40 mg total) by mouth daily. 04/01/13  Yes Salley Scarlet, MD  Multiple Vitamin (MULTIVITAMIN) tablet Take 1 tablet by mouth daily.     Yes Historical Provider, MD  Omega-3 Fatty Acids (FISH OIL) 1200 MG CAPS Take 2 capsules by mouth 2 (two) times daily.     Yes Historical Provider, MD   omeprazole (PRILOSEC) 20 MG capsule Take 1 capsule (20 mg total) by mouth daily. 04/01/13  Yes Salley Scarlet, MD  verapamil (CALAN) 120 MG tablet Take 1 tablet (120 mg total) by mouth 2 (two) times daily. 04/01/13  Yes Salley Scarlet, MD  vitamin C (ASCORBIC ACID) 500 MG tablet Take 500 mg by mouth daily.     Yes Historical Provider, MD  zolpidem (AMBIEN) 10 MG tablet Take 1 tablet (10 mg total) by mouth at bedtime as needed. 04/01/13  Yes Salley Scarlet, MD   Physical Exam: Filed Vitals:   08/24/13 1105  BP: 126/76  Pulse: 78  Temp: 98.3 F (36.8 C)  Resp: 18     General:  Obese no acute distress  Eyes: PE RRL, EOMI, no scleral icterus  ENT: Ears clear nose without drainage oropharynx without erythema or exudate. Mucous membranes of her mouth are pink and dry. Very poor dentition  Neck: Supple no JVD full range of motion no lymphadenopathy  Cardiovascular: Regular rate and rhythm no murmur gallop or rub  no lower extremity edema  Respiratory: Normal effort breath sounds clear bilaterally to auscultation no wheeze no rhonchi  Abdomen: Obese slightly farm with diminished bowel sounds moderate tenderness in left lower quadrant with some guarding.  Skin: Warm and dry no rash no lesions  Musculoskeletal: No clubbing no cyanosis. Lower extremity strength 5 over 5 bilaterally. Sensation intact  Psychiatric: Calm cooperative  Neurologic: Radial nerves II through XII grossly intact speech clear facial symmetry  Labs on Admission:  Basic Metabolic Panel:  Recent Labs Lab 08/24/13 0529  NA 133*  K 4.9  CL 96  CO2 27  GLUCOSE 246*  BUN 22  CREATININE 1.06  CALCIUM 9.8   Liver Function Tests: No results found for this basename: AST, ALT, ALKPHOS, BILITOT, PROT, ALBUMIN,  in the last 168 hours No results found for this basename: LIPASE, AMYLASE,  in the last 168 hours No results found for this basename: AMMONIA,  in the last 168 hours CBC:  Recent Labs Lab  08/24/13 0529  WBC 13.5*  NEUTROABS 9.4*  HGB 13.2  HCT 37.8  MCV 86.7  PLT 193   Cardiac Enzymes: No results found for this basename: CKTOTAL, CKMB, CKMBINDEX, TROPONINI,  in the last 168 hours  BNP (last 3 results) No results found for this basename: PROBNP,  in the last 8760 hours CBG: No results found for this basename: GLUCAP,  in the last 168 hours  Radiological Exams on Admission: Ct Abdomen Pelvis W Contrast  08/24/2013   *RADIOLOGY REPORT*  Clinical Data:  Left lower quadrant pain.  History of diverticulitis.  CT ABDOMEN AND PELVIS WITH CONTRAST  Technique:  Multidetector CT imaging of the abdomen and pelvis was performed using the standard protocol following bolus administration of intravenous contrast.  Contrast: 50mL OMNIPAQUE IOHEXOL 300 MG/ML  SOLN, OMNIPAQUE IOHEXOL 300 MG/ML  SOLN  Comparison:  05/20/2010.  Findings:  Lung bases show no acute findings.  Heart is at the upper limits of normal in size.  Mitral annulus calcification.  No pericardial or pleural effusion.  Pre pericardiac lymph nodes are sub centimeter in short axis size.  Liver is decreased attenuation diffusely.  Liver, gallbladder and adrenal glands are otherwise unremarkable.  7 mm low attenuation lesion in the interpolar right kidney is unchanged from 05/20/2010 and most likely a cyst.  Kidneys are otherwise unremarkable.  11 mm low attenuation lesion in the spleen is unchanged.  The spleen, pancreas, stomach, small bowel and proximal colon are otherwise unremarkable.  There is thickening and mild haziness involving the distal descending/proximal sigmoid colon (series 2, image 67).  No associated fluid collection or extraluminal air.  Hysterectomy.  Ovaries are visualized.  Atherosclerotic calcification of the arterial vasculature without abdominal aortic aneurysm.  No pathologically enlarged lymph nodes.  No free fluid. No worrisome lytic or sclerotic lesions.  Degenerative changes are seen in the spine and  left hip.  IMPRESSION:  1.  Diverticulitis involving the distal descending/proximal sigmoid colon.  No evidence of abscess or perforation. 2.  Hepatomegaly.   Original Report Authenticated By: Leanna Battles, M.D.    EKG:   Assessment/Plan Principal Problem:   Diverticulitis: Patient with history of same. Admit to medical floor. Will provide IV Cipro and Flagyl do to patient's persistent nausea. Will provide clear liquid diet and monitor her ability to tolerate this closely. No diarrhea reported. Support with IV fluids as well. Will provide pain medicine and anti-emetic needed Active Problems: Nausea: Persistent without vomiting. Will provide Zofran as needed  for nausea. Will provide clear liquid diet. If unable to tolerate will make n.p.o.    Leukocytosis: Mild. Likely related to #1. Will provide Cipro and Flagyl. Max temp since presentation 99.3. Patient is nontoxic appearing. Will monitor  Insulin dependent diabetes mellitus: Patient takes 70 the 80 units of Levemir twice a day of as well as sliding scale. We will check her hemoglobin A1c. I will provide Levemir or half the dose given her clear liquid diet. Will use sliding scale insulin for glycemic control.    GERD (gastroesophageal reflux disease): Stable at baseline      HYPOTHYROIDISM: will check her TSH. Continue Synthroid      HYPERTENSION, : Fair control. Will continue her home meds specifically lisinopril     DIVERTICULAR DISEASE: See #1    Depression: Stable at baseline. Will continue home meds.    Patient will be admitted to the service of Dr. Felecia Shelling.  [facility] will be available for any issues until 7AM on 8/28  Code Status: full Family Communication: friend at bedside Disposition Plan: home when ready   Time spent: 60 minutes  Gwenyth Bender [facility] Pager [phone]  If 7PM-7AM, please contact night-coverage [email or web address] Password [facility] 08/24/2013, 11:21 AM  Attending note:  Patient seen and  examined. Above note reviewed.  She has been admitted with acute diverticulitis.  She has been started on IV antibiotics due to nausea and inability to tolerate po.  Will give supportive treatment.  Advance diet as tolerated.  MEMON,JEHANZEB

## 2013-08-25 ENCOUNTER — Encounter (HOSPITAL_COMMUNITY): Payer: Self-pay | Admitting: Gastroenterology

## 2013-08-25 ENCOUNTER — Telehealth: Payer: Self-pay | Admitting: Gastroenterology

## 2013-08-25 DIAGNOSIS — K5732 Diverticulitis of large intestine without perforation or abscess without bleeding: Secondary | ICD-10-CM

## 2013-08-25 LAB — CBC
Hemoglobin: 11.4 g/dL — ABNORMAL LOW (ref 12.0–15.0)
MCH: 30.6 pg (ref 26.0–34.0)
MCHC: 35.4 g/dL (ref 30.0–36.0)
MCV: 86.3 fL (ref 78.0–100.0)
RBC: 3.73 MIL/uL — ABNORMAL LOW (ref 3.87–5.11)

## 2013-08-25 LAB — GLUCOSE, CAPILLARY
Glucose-Capillary: 211 mg/dL — ABNORMAL HIGH (ref 70–99)
Glucose-Capillary: 230 mg/dL — ABNORMAL HIGH (ref 70–99)

## 2013-08-25 LAB — BASIC METABOLIC PANEL
BUN: 14 mg/dL (ref 6–23)
CO2: 26 mEq/L (ref 19–32)
Calcium: 8.8 mg/dL (ref 8.4–10.5)
GFR calc non Af Amer: 52 mL/min — ABNORMAL LOW (ref >90)
Glucose, Bld: 209 mg/dL — ABNORMAL HIGH (ref 70–99)

## 2013-08-25 LAB — HEMOGLOBIN A1C
Hgb A1c MFr Bld: 8.9 % — ABNORMAL HIGH (ref <5.7)
Mean Plasma Glucose: 209 mg/dL — ABNORMAL HIGH (ref <117)

## 2013-08-25 MED ORDER — HYDROMORPHONE HCL PF 1 MG/ML IJ SOLN
0.5000 mg | INTRAMUSCULAR | Status: DC | PRN
  Administered 2013-08-25 – 2013-08-28 (×15): 0.5 mg via INTRAVENOUS
  Filled 2013-08-25 (×15): qty 1

## 2013-08-25 MED ORDER — OMEPRAZOLE 20 MG PO CPDR: 20.0000 mg | DELAYED_RELEASE_CAPSULE | Freq: Two times a day (BID) | ORAL | Status: AC

## 2013-08-25 NOTE — Telephone Encounter (Signed)
RX for omeprazole 20mg  BID sent to [facility].

## 2013-08-25 NOTE — Progress Notes (Signed)
Inpatient Diabetes Program Recommendations  AACE/ADA: New Consensus Statement on Inpatient Glycemic Control (2013)  Target Ranges:  Prepandial:   less than 140 mg/dL      Peak postprandial:   less than 180 mg/dL (1-2 hours)      Critically ill patients:  140 - 180 mg/dL   Results for GLENNDA, WEATHERHOLTZ (MRN [id number]) as of 08/25/2013 08:57  Ref. Range 08/24/2013 11:39 08/24/2013 16:59 08/24/2013 21:27 08/25/2013 07:29  Glucose-Capillary Latest Range: 70-99 mg/dL 295 (H) 621 (H) 308 (H) 211 (H)    Inpatient Diabetes Program Recommendations Insulin - Basal: Please consider increasing Levemir to 45 units QHS. Correction (SSI): Please consider increasing Novolog correction to resistant scale and add bedtime correction.  Note: Patient has a history of diabetes and takes Levemir 70 units QAM, Levemir 80 units QPM, and Novolog 36 units TID at home for diabetes management.  Currently, patient is ordered to receive Levemir 40 units QHS and Novolog 0-15 units AC for inpatient glycemic control.  Bedtime glucose last night was 273 mg/dl and fasting glucose 657 mg/dl this morning.  Please increase Levemir to 45 units QHS and increase Novolog correction to Resistant scale and add bedtime correction.  Will continue to follow.  Thanks, Orlando Penner, RN, MSN, CCRN Diabetes Coordinator Inpatient Diabetes Program [phone]

## 2013-08-25 NOTE — Consult Note (Signed)
Referring Provider: Avon Gully, MD Primary Care Physician:  Avon Gully, MD Primary Gastroenterologist:  Jonette Eva, MD  Reason for Consultation:  diverticulitis  HPI: [name] is a 62 y.o. female with past medical history of diabetes, hypertension, GERD, diverticulosis who presented to the Emergency Department with left lower quadrant pain and nausea. Symptoms have been occurring for about 3 days. Pain was sharp. One episode of loose stools without blood or melena. Low-grade temperature of 100 the day of admission. CT scan done showed diverticulitis involving the distal descending/proximal sigmoid colon without evidence of abscess or perforation. CT documented diverticulitis back in May of 2011, was her initial episode.   States her upper abdominal pain and reflux for which she was seen by Dr. Darrick Penna recently in July improved on omeprazole 20 mg twice a day. She has recurrent symptoms on once daily dosing and is requesting prescription for twice daily. Denies melena, rectal bleeding, dysphagia, vomiting, weight loss.   Prior to Admission medications   Medication Sig Start Date End Date Taking? Authorizing Provider  Cholecalciferol (VITAMIN D) 2000 UNITS tablet Take 2,000 Units by mouth daily.   Yes Historical Provider, MD  citalopram (CELEXA) 40 MG tablet Take 0.5 tablets (20 mg total) by mouth daily. 12/21/12  Yes Salley Scarlet, MD  CRANBERRY EXTRACT PO Take 84 mg by mouth 2 (two) times daily.    Yes Historical Provider, MD  diphenoxylate-atropine (LOMOTIL) 2.5-0.025 MG per tablet Take 1 tablet by mouth 4 (four) times daily as needed. 09/28/12  Yes Salley Scarlet, MD  HYDROcodone-acetaminophen (NORCO/VICODIN) 5-325 MG per tablet Take 0.5 tablets by mouth every 6 (six) hours as needed for pain. 04/05/13  Yes Salley Scarlet, MD  insulin aspart (NOVOLOG) 100 UNIT/ML injection Inject 36 Units into the skin 3 (three) times daily before meals. Based on sliding scale   Yes Historical  Provider, MD  insulin detemir (LEVEMIR) 100 UNIT/ML injection Inject 70-80 Units into the skin 2 (two) times daily. 70units in AM and 80 units in pm   Yes Historical Provider, MD  levothyroxine (SYNTHROID, LEVOTHROID) 75 MCG tablet Take 75 mcg by mouth at bedtime.    Yes Historical Provider, MD  lisinopril (PRINIVIL,ZESTRIL) 40 MG tablet Take 1 tablet (40 mg total) by mouth daily. 04/01/13  Yes Salley Scarlet, MD  Multiple Vitamin (MULTIVITAMIN) tablet Take 1 tablet by mouth daily.     Yes Historical Provider, MD  Omega-3 Fatty Acids (FISH OIL) 1200 MG CAPS Take 2 capsules by mouth 2 (two) times daily.     Yes Historical Provider, MD  omeprazole (PRILOSEC) 20 MG capsule Take 1 capsule (20 mg total) by mouth daily. 04/01/13  Yes Salley Scarlet, MD  verapamil (CALAN) 120 MG tablet Take 1 tablet (120 mg total) by mouth 2 (two) times daily. 04/01/13  Yes Salley Scarlet, MD  vitamin C (ASCORBIC ACID) 500 MG tablet Take 500 mg by mouth daily.     Yes Historical Provider, MD  zolpidem (AMBIEN) 10 MG tablet Take 1 tablet (10 mg total) by mouth at bedtime as needed. 04/01/13  Yes Salley Scarlet, MD    Current Facility-Administered Medications  Medication Dose Route Frequency Provider Last Rate Last Dose  . 0.9 %  sodium chloride infusion   Intravenous Continuous Gwenyth Bender, NP 75 mL/hr at 08/24/13 2209    . acetaminophen (TYLENOL) tablet 650 mg  650 mg Oral Q6H PRN Gwenyth Bender, NP   650 mg at 08/25/13 [phone]  Or  . acetaminophen (TYLENOL) suppository 650 mg  650 mg Rectal Q6H PRN Gwenyth Bender, NP      . alum & mag hydroxide-simeth (MAALOX/MYLANTA) 200-200-20 MG/5ML suspension 30 mL  30 mL Oral Q6H PRN Gwenyth Bender, NP      . ciprofloxacin (CIPRO) IVPB 400 mg  400 mg Intravenous Q12H Lesle Chris Black, NP   400 mg at 08/25/13 0757  . citalopram (CELEXA) tablet 20 mg  20 mg Oral Daily Gwenyth Bender, NP   20 mg at 08/25/13 0925  . enoxaparin (LOVENOX) injection 40 mg  40 mg Subcutaneous Daily Gwenyth Bender, NP   40 mg at 08/25/13 0925  . HYDROmorphone (DILAUDID) injection 0.5 mg  0.5 mg Intravenous Q4H PRN Tiffany Kocher, PA-C      . insulin aspart (novoLOG) injection 0-15 Units  0-15 Units Subcutaneous TID WC Gwenyth Bender, NP   5 Units at 08/25/13 0805  . insulin detemir (LEVEMIR) injection 40 Units  40 Units Subcutaneous QHS Gwenyth Bender, NP   40 Units at 08/24/13 2205  . lisinopril (PRINIVIL,ZESTRIL) tablet 40 mg  40 mg Oral Daily Gwenyth Bender, NP   40 mg at 08/25/13 1610  . metroNIDAZOLE (FLAGYL) IVPB 500 mg  500 mg Intravenous Q8H Lesle Chris Black, NP   500 mg at 08/25/13 0757  . ondansetron (ZOFRAN) tablet 4 mg  4 mg Oral Q6H PRN Gwenyth Bender, NP       Or  . ondansetron [facility]) injection 4 mg  4 mg Intravenous Q6H PRN Gwenyth Bender, NP   4 mg at 08/25/13 9604  . verapamil (CALAN) tablet 120 mg  120 mg Oral BID Gwenyth Bender, NP   120 mg at 08/25/13 5409  . zolpidem (AMBIEN) tablet 5 mg  5 mg Oral QHS PRN Gwenyth Bender, NP   5 mg at 08/25/13 0139    Allergies as of 08/24/2013 - Review Complete 08/24/2013  Allergen Reaction Noted  . Meperidine hcl Nausea And Vomiting 08/22/2010  . Penicillins Hives 08/22/2010    Past Medical History  Diagnosis Date  . Diabetes mellitus   . Hypertension   . Acute diverticulosis     First episode 2011. second episode August 2014.  . Drug allergy     To augmentin  . Osteoarthritis   . GERD (gastroesophageal reflux disease)   . Hyperlipidemia   . Depression   . Hypothyroidism   . Internal hemorrhoids with other complication 07/07/2013    Past Surgical History  Procedure Laterality Date  . Partial hysterectomy    . Bladder surgery      Tack  . Carpal tunnel release      Bilateral  . Arm surgery      Bilateral  . Lipoma excision      From abdomen  . Tonsillectomy    . Goiter removal    . Ileocolonoscopy  04/06/2008    WJX:BJYNWG terminal ileum, approximately 15-20 cm visualized/sigmoid colon diverticula/Normal retroflexed view of  the rectum.  . Esophagogastroduodenoscopy  04/06/2008    NFA:OZHYQM erosions with occasional ulceration in the distal esophagus at the GE junction.  Patent fibrous ring/3-4 cm sliding hiatal hernia/Normal duodenal bulb ampulla in second portion of the duodenum  . Cataract extraction      bilateral  . Colonoscopy N/A 06/22/2013    VHQ:IONGEXBM HEMORRHOIDS/Mild diverticulosis  in the ascending colon &s moderate diverticulosis  in the descending colon and sigmoid colon/Two SMALL RECTAL  polyps & The colon IS redundant. hyperplastic polyp. next TCS 05/2023.  Marland Kitchen Hemorrhoid banding N/A 06/22/2013    SLF: BANDS APPLIED SUCCESSFULLY    Family History  Problem Relation Age of Onset  . Hypertension Other   . Coronary artery disease Other   . Diabetes Other   . Stroke Mother   . Diabetes Mother   . Hypertension Mother   . Heart disease Father   . Hyperlipidemia Father   . Diabetes Brother   . Hypertension Brother   . Colon cancer Neg Hx     History   Social History  . Marital Status: Widowed    Spouse Name: N/A    Number of Children: N/A  . Years of Education: N/A   Occupational History  . Retired    Social History Main Topics  . Smoking status: Never Smoker   . Smokeless tobacco: Not on file  . Alcohol Use: No  . Drug Use: No  . Sexual Activity: Not on file   Other Topics Concern  . Not on file   Social History Narrative  . No narrative on file     ROS:  General: Negative for anorexia, weight loss, fever, chills, fatigue, weakness. Eyes: Negative for vision changes.  ENT: Negative for hoarseness, difficulty swallowing , nasal congestion. CV: Negative for chest pain, angina, palpitations, dyspnea on exertion, peripheral edema.  Respiratory: Negative for dyspnea at rest, dyspnea on exertion, cough, sputum, wheezing.  GI: See history of present illness. GU:  Negative for dysuria, hematuria, urinary incontinence, urinary frequency, nocturnal urination.  MS: Negative for joint  pain, low back pain.  Derm: Negative for rash or itching.  Neuro: Negative for weakness, abnormal sensation, seizure, frequent headaches, memory loss, confusion.  Psych: Negative for anxiety, depression, suicidal ideation, hallucinations.  Endo: Negative for unusual weight change.  Heme: Negative for bruising or bleeding. Allergy: Negative for rash or hives.       Physical Examination: Vital signs in last 24 hours: Temp:  [98.3 F (36.8 C)-99.3 F (37.4 C)] 98.8 F (37.1 C) (08/28 0526) Pulse Rate:  [77-89] 78 (08/28 0526) Resp:  [18-20] 20 (08/28 0526) BP: (107-174)/(56-90) 174/90 mmHg (08/28 0923) SpO2:  [92 %-97 %] 95 % (08/28 0526) Weight:  [113 lb 3.2 oz (51.347 kg)] 113 lb 3.2 oz (51.347 kg) (08/27 1135) Last BM Date: 08/24/13  General: Well-nourished, well-developed in no acute distress.  Head: Normocephalic, atraumatic.   Eyes: Conjunctiva pink, no icterus. Mouth: Oropharyngeal mucosa moist and pink , no lesions erythema or exudate. Neck: Supple without thyromegaly, masses, or lymphadenopathy.  Lungs: Clear to auscultation bilaterally.  Heart: Regular rate and rhythm, no murmurs rubs or gallops.  Abdomen: Bowel sounds are normal, moderate left lower quadrant tenderness, nondistended, no hepatosplenomegaly or masses, no abdominal bruits or    hernia , no rebound or guarding.   Rectal: Not performed Extremities: No lower extremity edema, clubbing, deformity.  Neuro: Alert and oriented x 4 , grossly normal neurologically.  Skin: Warm and dry, no rash or jaundice.   Psych: Alert and cooperative, normal mood and affect.        Intake/Output from previous day: 08/27 0701 - 08/28 0700 In: 1815 [P.O.:360; I.V.:1255; IV Piggyback:200] Out: 750 [Urine:750] Intake/Output this shift: Total I/O In: 240 [P.O.:240] Out: -   Lab Results: CBC  Recent Labs  08/24/13 0529 08/24/13 1202 08/25/13 0558  WBC 13.5* 12.5* 12.0*  HGB 13.2 11.8* 11.4*  HCT 37.8 33.8* 32.2*  MCV  86.7 86.9  86.3  PLT 193 160 171   BMET  Recent Labs  08/24/13 0529 08/25/13 0558  NA 133* 129*  K 4.9 4.2  CL 96 94*  CO2 27 26  GLUCOSE 246* 209*  BUN 22 14  CREATININE 1.06 1.09  CALCIUM 9.8 8.8   Imaging Studies: Ct Abdomen Pelvis W Contrast  08/24/2013   *RADIOLOGY REPORT*  Clinical Data:  Left lower quadrant pain.  History of diverticulitis.  CT ABDOMEN AND PELVIS WITH CONTRAST  Technique:  Multidetector CT imaging of the abdomen and pelvis was performed using the standard protocol following bolus administration of intravenous contrast.  Contrast: 50mL OMNIPAQUE IOHEXOL 300 MG/ML  SOLN, OMNIPAQUE IOHEXOL 300 MG/ML  SOLN  Comparison:  05/20/2010.  Findings:  Lung bases show no acute findings.  Heart is at the upper limits of normal in size.  Mitral annulus calcification.  No pericardial or pleural effusion.  Pre pericardiac lymph nodes are sub centimeter in short axis size.  Liver is decreased attenuation diffusely.  Liver, gallbladder and adrenal glands are otherwise unremarkable.  7 mm low attenuation lesion in the interpolar right kidney is unchanged from 05/20/2010 and most likely a cyst.  Kidneys are otherwise unremarkable.  11 mm low attenuation lesion in the spleen is unchanged.  The spleen, pancreas, stomach, small bowel and proximal colon are otherwise unremarkable.  There is thickening and mild haziness involving the distal descending/proximal sigmoid colon (series 2, image 67).  No associated fluid collection or extraluminal air.  Hysterectomy.  Ovaries are visualized.  Atherosclerotic calcification of the arterial vasculature without abdominal aortic aneurysm.  No pathologically enlarged lymph nodes.  No free fluid. No worrisome lytic or sclerotic lesions.  Degenerative changes are seen in the spine and left hip.  IMPRESSION:  1.  Diverticulitis involving the distal descending/proximal sigmoid colon.  No evidence of abscess or perforation. 2.  Hepatomegaly.   Original Report  Authenticated By: Leanna Battles, M.D.  Pierre.Al[facility] week]   Impression: 62 year old lady with her second episode of acute uncomplicated diverticulitis. First episode in 2011. Colonoscopy up-to-date, June of this year.  Plan: 1. Continue Cipro and Flagyl. 2. Currently patient only has Tylenol for pain. New Rx for Dilaudid 0.5 mg IV every 4 hours when necessary. 3. Prescription [facility] omeprazole 20 [facility] 4. Continue clear liquid diet for now.   LOS: 1 day   Tana Coast  08/25/2013, 9:56 AM  Attending note:  Patient seen and examined. CT reviewed. Agree with above impression and plan. We'll reassess tomorrow morning.

## 2013-08-25 NOTE — Progress Notes (Signed)
Pt accidentally pulled IV site out.

## 2013-08-25 NOTE — Care Management Note (Addendum)
    Page 1 of 1   08/30/2013     9:27:44 AM   CARE MANAGEMENT NOTE 08/30/2013  Patient:  [name], [name]   Account Number:  [id number]  Date Initiated:  08/25/2013  Documentation initiated by:  Sharrie Rothman  Subjective/Objective Assessment:   Pt admitted from home with diverticulits. Pt lives alone and will return home at discharge. Pt has very close friends that check on pt frequently. Pt is still driving and independent with ADL's.     Action/Plan:   No CM needs noted.   Anticipated DC Date:  08/27/2013   Anticipated DC Plan:  HOME/SELF CARE      DC Planning Services  CM consult      Choice offered to / List presented to:             Status of service:  Completed, signed off Medicare Important Message given?  YES (If response is "NO", the following Medicare IM given date fields will be blank) Date Medicare IM given:  08/26/2013 Date Additional Medicare IM given:  08/30/2013  Discharge Disposition:  HOME/SELF CARE  Per UR Regulation:    If discussed at Long Length of Stay Meetings, dates discussed:    Comments:  08/30/13 0923 Arlyss Queen, RN BSN CM Pt discharged home today. No CM needs noted.  08/25/13 1335 Arlyss Queen, RN BSN CM

## 2013-08-25 NOTE — Progress Notes (Signed)
Subjective: Patient admitted yesterday due to abdominal pain as case of acute diverticulitis. She is on combination IV antibiotics. She has abdominal pain, but no nausea or vomiting.  Objective: Vital signs in last 24 hours: Temp:  [98.8 F (37.1 C)-99.3 F (37.4 C)] 98.8 F (37.1 C) (08/28 0526) Pulse Rate:  [78-89] 78 (08/28 0526) Resp:  [20] 20 (08/28 0526) BP: (122-174)/(66-90) 174/90 mmHg (08/28 0923) SpO2:  [93 %-95 %] 95 % (08/28 0526) Weight change:  Last BM Date: 08/24/13  Intake/Output from previous day: 08/27 0701 - 08/28 0700 In: 1815 [P.O.:360; I.V.:1255; IV Piggyback:200] Out: 750 [Urine:750]  PHYSICAL EXAM General appearance: alert and no distress Resp: clear to auscultation bilaterally Cardio: S1, S2 normal GI: diffuse tenderness of the lower quadrants, bowel sound ++ Extremities: extremities normal, atraumatic, no cyanosis or edema  Lab Results:    @labtest @ [facility] No results found for this basename: PHART, PCO2, PO2ART, TCO2, HCO3,  in the last 72 hours CULTURES No results found for this or any previous visit (from the past 240 hour(s)). Studies/Results: Ct Abdomen Pelvis W Contrast  08/24/2013   *RADIOLOGY REPORT*  Clinical Data:  Left lower quadrant pain.  History of diverticulitis.  CT ABDOMEN AND PELVIS WITH CONTRAST  Technique:  Multidetector CT imaging of the abdomen and pelvis was performed using the standard protocol following bolus administration of intravenous contrast.  Contrast: 50mL OMNIPAQUE IOHEXOL 300 MG/ML  SOLN, OMNIPAQUE IOHEXOL 300 MG/ML  SOLN  Comparison:  05/20/2010.  Findings:  Lung bases show no acute findings.  Heart is at the upper limits of normal in size.  Mitral annulus calcification.  No pericardial or pleural effusion.  Pre pericardiac lymph nodes are sub centimeter in short axis size.  Liver is decreased attenuation diffusely.  Liver, gallbladder and adrenal glands are otherwise unremarkable.  7 mm low attenuation lesion in  the interpolar right kidney is unchanged from 05/20/2010 and most likely a cyst.  Kidneys are otherwise unremarkable.  11 mm low attenuation lesion in the spleen is unchanged.  The spleen, pancreas, stomach, small bowel and proximal colon are otherwise unremarkable.  There is thickening and mild haziness involving the distal descending/proximal sigmoid colon (series 2, image 67).  No associated fluid collection or extraluminal air.  Hysterectomy.  Ovaries are visualized.  Atherosclerotic calcification of the arterial vasculature without abdominal aortic aneurysm.  No pathologically enlarged lymph nodes.  No free fluid. No worrisome lytic or sclerotic lesions.  Degenerative changes are seen in the spine and left hip.  IMPRESSION:  1.  Diverticulitis involving the distal descending/proximal sigmoid colon.  No evidence of abscess or perforation. 2.  Hepatomegaly.   Original Report Authenticated By: Leanna Battles, M.D.    Medications: I have reviewed the patient's current medications.  Assesment: Principal Problem:   Diverticulitis Active Problems:   HYPOTHYROIDISM   HYPERLIPIDEMIA   HYPERTENSION,    DIVERTICULAR DISEASE   Depression   Insulin dependent diabetes mellitus   GERD (gastroesophageal reflux disease)   Nausea   Back pain   Leukocytosis    Plan: Medications reviewed Continue iv antibiotics GI consult.    LOS: 1 day   [name] 08/25/2013, 1:19 PM

## 2013-08-26 ENCOUNTER — Ambulatory Visit: Payer: Self-pay | Admitting: Family Medicine

## 2013-08-26 ENCOUNTER — Ambulatory Visit: Payer: Medicare Other | Admitting: Family Medicine

## 2013-08-26 LAB — GLUCOSE, CAPILLARY
Glucose-Capillary: 228 mg/dL — ABNORMAL HIGH (ref 70–99)
Glucose-Capillary: 206 mg/dL — ABNORMAL HIGH (ref 70–99)
Glucose-Capillary: 216 mg/dL — ABNORMAL HIGH (ref 70–99)
Glucose-Capillary: 215 mg/dL — ABNORMAL HIGH (ref 70–99)

## 2013-08-26 NOTE — Progress Notes (Signed)
Inpatient Diabetes Program Recommendations  AACE/ADA: New Consensus Statement on Inpatient Glycemic Control (2013)  Target Ranges:  Prepandial:   less than 140 mg/dL      Peak postprandial:   less than 180 mg/dL (1-2 hours)      Critically ill patients:  140 - 180 mg/dL   Results for [name], [name] (MRN [id number]) as of 08/26/2013 14:19  Ref. Range 08/25/2013 07:29 08/25/2013 11:29 08/25/2013 16:17 08/25/2013 20:28 08/26/2013 07:33 08/26/2013 11:42  Glucose-Capillary Latest Range: 70-99 mg/dL 409 (H) 811 (H) 914 (H) 193 (H) 228 (H) 206 (H)    Inpatient Diabetes Program Recommendations Insulin - Basal: Please consider increasing Levemir to 45 units QHS. Correction (SSI): Please consider increasing Novolog correction to resistant scale and add bedtime correction.  Note: Patient has a history of diabetes and takes Levemir 70 units QAM, Levemir 80 units QPM, and Novolog 35-40 units TID (based on sliding scale) at home for diabetes management. Currently, patient is ordered to receive Levemir 40 units QHS and Novolog 0-15 units AC for inpatient glycemic control. Bedtime glucose last night was 193 mg/dl and fasting glucose 782 mg/dl this morning. Please increase Levemir to 45 units QHS and increase Novolog correction to Resistant scale and add bedtime correction.   Talked with the patient about diabetes and home regimen for diabetes control.  Patient sees Dr. Fransico Him for diabetes management and has been working with him for the last 3 years.  Patient reports that she was on an insulin pump in the past and is working toward going back on an insulin pump in the near future.  In discussing home regimen, patient reports that she has fairly good glycemic control with the current regimen and she states that she is not having any low blood sugars with her current outpatient regimen.  She reports that her last A1C was 8.1% when she last saw Dr. Fransico Him. Patient is very knowledgeable on diabetes and seems eager to get  tight control of her diabetes.  Will continue to follow as an inpatient.   Thanks,  Orlando Penner, RN, MSN, CCRN  Diabetes Coordinator  Inpatient Diabetes Program  [phone]

## 2013-08-26 NOTE — Plan of Care (Signed)
Problem: Phase II Progression Outcomes Goal: Progress activity as tolerated unless otherwise ordered Outcome: Completed/Met Date Met:  08/26/13 Patient ambulated in hallway without difficulty.

## 2013-08-26 NOTE — Progress Notes (Signed)
Subjective:  Feeling better. Pain still present but some better. Wants to try different diet. Small BM yesterday.   Objective: Vital signs in last 24 hours: Temp:  [98.6 F (37 C)-100.1 F (37.8 C)] 98.7 F (37.1 C) (08/29 0508) Pulse Rate:  [73-88] 81 (08/29 0508) Resp:  [20] 20 (08/29 0508) BP: (108-174)/(53-90) 137/64 mmHg (08/29 0508) SpO2:  [92 %-95 %] 95 % (08/29 0508) Last BM Date: 08/25/13 General:   Alert,  Well-developed, well-nourished, pleasant and cooperative in NAD Head:  Normocephalic and atraumatic. Eyes:  Sclera clear, no icterus.   Abdomen:  Soft, LLQ tenderness and nondistended.   Normal bowel sounds, without guarding, and without rebound.   Extremities:  Without clubbing, deformity or edema. Neurologic:  Alert and  oriented x4;  grossly normal neurologically. Skin:  Intact without significant lesions or rashes. Psych:  Alert and cooperative. Normal mood and affect.  Intake/Output from previous day: 08/28 0701 - 08/29 0700 In: 6081.8 [P.O.:720; I.V.:4461.8; IV Piggyback:900] Out: 2025 [Urine:2025] Intake/Output this shift:    Lab Results: CBC  Recent Labs  08/24/13 0529 08/24/13 1202 08/25/13 0558  WBC 13.5* 12.5* 12.0*  HGB 13.2 11.8* 11.4*  HCT 37.8 33.8* 32.2*  MCV 86.7 86.9 86.3  PLT 193 160 171   BMET  Recent Labs  08/24/13 0529 08/25/13 0558  NA 133* 129*  K 4.9 4.2  CL 96 94*  CO2 27 26  GLUCOSE 246* 209*  BUN 22 14  CREATININE 1.06 1.09  CALCIUM 9.8 8.8      Assessment: Acute uncomplicated diverticulitis, slowly improving. Hyponatremia.   Plan: 1. Continue IV cipro/flagyl for now. 2. Full liquid diet. 3. Hyponatremia per attending.   LOS: 2 days   Tana Coast  08/26/2013, 7:40 AM   Attending note:  Patient seen and examined earlier today. Agree with above assessment and plan. Dr. Karilyn Cota will see over the weekend.

## 2013-08-26 NOTE — Progress Notes (Signed)
Patient advanced to Full liquid diet,did have some nausea with meal ,was medicated with Zofran 4 mg IV prn given.Will continue to monitor patient.

## 2013-08-26 NOTE — Progress Notes (Signed)
Subjective: Patient is resting. Continued to have nausea and abdominal pain. No fever or chills.  Objective: Vital signs in last 24 hours: Temp:  [98.6 F (37 C)-100.1 F (37.8 C)] 98.7 F (37.1 C) (08/29 0508) Pulse Rate:  [73-88] 81 (08/29 0508) Resp:  [20] 20 (08/29 0508) BP: (108-174)/(53-90) 137/64 mmHg (08/29 0508) SpO2:  [92 %-95 %] 95 % (08/29 0508) Weight change:  Last BM Date: 08/25/13  Intake/Output from previous day: 08/28 0701 - 08/29 0700 In: 6081.8 [P.O.:720; I.V.:4461.8; IV Piggyback:900] Out: 2025 [Urine:2025]  PHYSICAL EXAM General appearance: alert and no distress Resp: clear to auscultation bilaterally Cardio: S1, S2 normal GI: diffuse tenderness of the lower quadrants, bowel sound ++ Extremities: extremities normal, atraumatic, no cyanosis or edema  Lab Results:    @labtest @ [facility] No results found for this basename: PHART, PCO2, PO2ART, TCO2, HCO3,  in the last 72 hours CULTURES No results found for this or any previous visit (from the past 240 hour(s)). Studies/Results: Ct Abdomen Pelvis W Contrast  08/24/2013   *RADIOLOGY REPORT*  Clinical Data:  Left lower quadrant pain.  History of diverticulitis.  CT ABDOMEN AND PELVIS WITH CONTRAST  Technique:  Multidetector CT imaging of the abdomen and pelvis was performed using the standard protocol following bolus administration of intravenous contrast.  Contrast: 50mL OMNIPAQUE IOHEXOL 300 MG/ML  SOLN, OMNIPAQUE IOHEXOL 300 MG/ML  SOLN  Comparison:  05/20/2010.  Findings:  Lung bases show no acute findings.  Heart is at the upper limits of normal in size.  Mitral annulus calcification.  No pericardial or pleural effusion.  Pre pericardiac lymph nodes are sub centimeter in short axis size.  Liver is decreased attenuation diffusely.  Liver, gallbladder and adrenal glands are otherwise unremarkable.  7 mm low attenuation lesion in the interpolar right kidney is unchanged from 05/20/2010 and most likely a cyst.   Kidneys are otherwise unremarkable.  11 mm low attenuation lesion in the spleen is unchanged.  The spleen, pancreas, stomach, small bowel and proximal colon are otherwise unremarkable.  There is thickening and mild haziness involving the distal descending/proximal sigmoid colon (series 2, image 67).  No associated fluid collection or extraluminal air.  Hysterectomy.  Ovaries are visualized.  Atherosclerotic calcification of the arterial vasculature without abdominal aortic aneurysm.  No pathologically enlarged lymph nodes.  No free fluid. No worrisome lytic or sclerotic lesions.  Degenerative changes are seen in the spine and left hip.  IMPRESSION:  1.  Diverticulitis involving the distal descending/proximal sigmoid colon.  No evidence of abscess or perforation. 2.  Hepatomegaly.   Original Report Authenticated By: Leanna Battles, M.D.    Medications: I have reviewed the patient's current medications.  Assesment: Principal Problem:   Diverticulitis Active Problems:   HYPOTHYROIDISM   HYPERLIPIDEMIA   HYPERTENSION,    DIVERTICULAR DISEASE   Depression   Insulin dependent diabetes mellitus   GERD (gastroesophageal reflux disease)   Nausea   Back pain   Leukocytosis    Plan: Medications reviewed Continue iv antibiotics GI consult appreciated   LOS: 2 days   Lynn Sissel 08/26/2013, 7:54 AM

## 2013-08-27 DIAGNOSIS — R109 Unspecified abdominal pain: Secondary | ICD-10-CM

## 2013-08-27 DIAGNOSIS — K5732 Diverticulitis of large intestine without perforation or abscess without bleeding: Secondary | ICD-10-CM

## 2013-08-27 DIAGNOSIS — R110 Nausea: Secondary | ICD-10-CM

## 2013-08-27 LAB — BASIC METABOLIC PANEL
BUN: 10 mg/dL (ref 6–23)
Chloride: 97 mEq/L (ref 96–112)
GFR calc Af Amer: 67 mL/min — ABNORMAL LOW (ref >90)
GFR calc non Af Amer: 58 mL/min — ABNORMAL LOW (ref >90)
Potassium: 4.2 mEq/L (ref 3.5–5.1)
Sodium: 133 mEq/L — ABNORMAL LOW (ref 135–145)

## 2013-08-27 LAB — CBC
HCT: 34.1 % — ABNORMAL LOW (ref 36.0–46.0)
MCHC: 35.5 g/dL (ref 30.0–36.0)
Platelets: 190 10*3/uL (ref 150–400)
RDW: 12.5 % (ref 11.5–15.5)
WBC: 7.8 10*3/uL (ref 4.0–10.5)

## 2013-08-27 LAB — GLUCOSE, CAPILLARY
Glucose-Capillary: 210 mg/dL — ABNORMAL HIGH (ref 70–99)
Glucose-Capillary: 184 mg/dL — ABNORMAL HIGH (ref 70–99)

## 2013-08-27 NOTE — Progress Notes (Signed)
Subjective: Patient complains of more nausea after she tried full liquid diet. Patient requests her diet to be changed to clear liquid diet.  Objective: Vital signs in last 24 hours: Temp:  [98.6 F (37 C)-99.4 F (37.4 C)] 98.6 F (37 C) (08/30 1610) Pulse Rate:  [66-75] 66 (08/30 0608) Resp:  [20] 20 (08/30 0608) BP: (126-174)/(63-76) 174/69 mmHg (08/30 0608) SpO2:  [94 %-96 %] 96 % (08/30 9604) Weight change:  Last BM Date: 08/26/13  Intake/Output from previous day: 08/29 0701 - 08/30 0700 In: 2469.5 [I.V.:2069.5; IV Piggyback:400] Out: 3251 [Urine:3250; Stool:1]  PHYSICAL EXAM General appearance: alert and no distress Resp: clear to auscultation bilaterally Cardio: S1, S2 normal GI: diffuse tenderness of the lower quadrants, bowel sound ++ Extremities: extremities normal, atraumatic, no cyanosis or edema  Lab Results:    @labtest @ [facility] No results found for this basename: PHART, PCO2, PO2ART, TCO2, HCO3,  in the last 72 hours CULTURES No results found for this or any previous visit (from the past 240 hour(s)). Studies/Results: No results found.  Medications: I have reviewed the patient's current medications.  Assesment: Principal Problem:   Diverticulitis Active Problems:   HYPOTHYROIDISM   HYPERLIPIDEMIA   HYPERTENSION,    DIVERTICULAR DISEASE   Depression   Insulin dependent diabetes mellitus   GERD (gastroesophageal reflux disease)   Nausea   Back pain   Leukocytosis    Plan: Medications reviewed Continue iv antibiotics Clear liquid diet.   LOS: 3 days   [name] 08/27/2013, 8:40 AM

## 2013-08-27 NOTE — Progress Notes (Signed)
Subjective; Patient reports nausea and worsening abdominal pain after she tried to full liquids at breakfast. She is back on clear liquids. She says pain is alleviated with medication. Pain is no worse than it was yesterday. Objective; BP 174/69  Pulse 66  Temp(Src) 98.6 F (37 C) (Oral)  Resp 20  Ht 4\' 10"  (1.473 m)  Wt 113 lb 3.2 oz (51.347 kg)  BMI 23.67 kg/m2  SpO2 96% Patient is alert and in no acute distress. Abdomen is full. Bowel sounds are normal. Abdomen is soft with mild to moderate tenderness at LLQ with some guarding but no rebound. No organomegaly or masses noted. No LE edema noted. Lab data; WBC 7.8, H&H 12.1 and 34.1 and platelet count 190K. Metabolic 7 normal except serum sodium 133 and glucose of 233. Assessment; Sigmoid diverticulitis. Patient did not tolerate full liquid diet. Agree with switching her back to clear liquids which she may need for another 24 hours. CT on admission was negative for abscess or fluid collection. Recommendations; Continue IV Cipro and metronidazole.  consider advancing diet after 24 hours.

## 2013-08-28 LAB — GLUCOSE, CAPILLARY
Glucose-Capillary: 265 mg/dL — ABNORMAL HIGH (ref 70–99)
Glucose-Capillary: 267 mg/dL — ABNORMAL HIGH (ref 70–99)

## 2013-08-28 NOTE — Progress Notes (Addendum)
Patient states she is feeling better today. She is having less pain. She says area of pain has gotten smaller. She denies nausea or vomiting. She passed a small amount of stool today. Her diet has been changed to full liquids by Dr. Felecia Shelling. She remains afebrile. Abdomen is full with normal bowel sounds. She has localized tenderness in LLQ with some guarding but no rebound. No organomegaly or masses. She has small umbilicus hernia which I did not appreciate yesterday. Assessment; Sigmoid diverticulitis with gradual improvement. Agree with advancing her diet to full liquids. If she does well she may be able to go home within the next 24-48 hours.

## 2013-08-28 NOTE — Progress Notes (Signed)
Subjective: Patient feels better. Her abdominal pain is less and the nausea has stopped. Wants to try full liquid diet. Objective: Vital signs in last 24 hours: Temp:  [98.7 F (37.1 C)-99.1 F (37.3 C)] 99 F (37.2 C) (08/31 0438) Pulse Rate:  [65-77] 70 (08/31 0438) Resp:  [20] 20 (08/31 0438) BP: (149-170)/(73-79) 163/76 mmHg (08/31 0438) SpO2:  [96 %-99 %] 98 % (08/31 0438) Weight change:  Last BM Date: 08/26/13  Intake/Output from previous day: 08/30 0701 - 08/31 0700 In: 2801.3 [I.V.:1801.3; IV Piggyback:1000] Out: 3300 [Urine:3300]  PHYSICAL EXAM General appearance: alert and no distress Resp: clear to auscultation bilaterally Cardio: S1, S2 normal GI: diffuse tenderness of the lower quadrants, bowel sound ++ Extremities: extremities normal, atraumatic, no cyanosis or edema  Lab Results:    @labtest @ [facility] No results found for this basename: PHART, PCO2, PO2ART, TCO2, HCO3,  in the last 72 hours CULTURES No results found for this or any previous visit (from the past 240 hour(s)). Studies/Results: No results found.  Medications: I have reviewed the patient's current medications.  Assesment: Principal Problem:   Diverticulitis Active Problems:   HYPOTHYROIDISM   HYPERLIPIDEMIA   HYPERTENSION,    DIVERTICULAR DISEASE   Depression   Insulin dependent diabetes mellitus   GERD (gastroesophageal reflux disease)   Nausea   Back pain   Leukocytosis    Plan: Medications reviewed Continue iv antibiotics Advance diet to full liquid.  LOS: 4 days   [name] 08/28/2013, 9:59 AM

## 2013-08-29 DIAGNOSIS — K5732 Diverticulitis of large intestine without perforation or abscess without bleeding: Secondary | ICD-10-CM

## 2013-08-29 LAB — GLUCOSE, CAPILLARY
Glucose-Capillary: 201 mg/dL — ABNORMAL HIGH (ref 70–99)
Glucose-Capillary: 293 mg/dL — ABNORMAL HIGH (ref 70–99)

## 2013-08-29 MED ORDER — CIPROFLOXACIN HCL 250 MG PO TABS
500.0000 mg | ORAL_TABLET | Freq: Two times a day (BID) | ORAL | Status: DC
  Administered 2013-08-29 – 2013-08-30 (×3): 500 mg via ORAL
  Filled 2013-08-29 (×3): qty 2

## 2013-08-29 MED ORDER — METRONIDAZOLE 500 MG PO TABS
500.0000 mg | ORAL_TABLET | Freq: Three times a day (TID) | ORAL | Status: DC
  Administered 2013-08-29 – 2013-08-30 (×3): 500 mg via ORAL
  Filled 2013-08-29 (×3): qty 1

## 2013-08-29 NOTE — Progress Notes (Signed)
Subjective: Feels better. Tolerating a full liquid diet. Wants to go home.  Objective: Vital signs in last 24 hours: Temp:  [97.6 F (36.4 C)-98.9 F (37.2 C)] 97.6 F (36.4 C) (09/01 0549) Pulse Rate:  [70-82] 70 (09/01 0549) Resp:  [20] 20 (09/01 0549) BP: (157-192)/(72-80) 170/72 mmHg (09/01 0549) SpO2:  [92 %-99 %] 99 % (09/01 0549) Last BM Date: 08/28/13 General:   Alert,  Well-developed, well-nourished, pleasant and cooperative in NAD Abdomen:   nontender and nondistended.  Normal bowel sounds, soft with minimal left lower quadrant tenderness. No rebound. No mass.  Extremities:  Without clubbing or edema.    Intake/Output from previous day: 08/31 0701 - 09/01 0700 In: 2472.5 [I.V.:1772.5; IV Piggyback:700] Out: 3400 [Urine:3400] Intake/Output this shift:    Lab Results:  Recent Labs  08/27/13 0611  WBC 7.8  HGB 12.1  HCT 34.1*  PLT 190   BMET  Recent Labs  08/27/13 0611  NA 133*  K 4.2  CL 97  CO2 27  GLUCOSE 233*  BUN 10  CREATININE 1.01  CALCIUM 9.2     Impression:  Sigmoid diverticulitis-improved.   Recommendations:   Low residue diet. Oral antibiotics. Anticipate discharge for the next 24 hours per attending

## 2013-08-29 NOTE — Progress Notes (Signed)
Patient's last BM was 08/29/13, val dildy, LPN to update on assessment

## 2013-08-29 NOTE — Progress Notes (Signed)
Subjective: Patient is improving. She has tolerated full liquid diet. No nausea or vomiting. Objective: Vital signs in last 24 hours: Temp:  [97.6 F (36.4 C)-98.9 F (37.2 C)] 97.6 F (36.4 C) (09/01 0549) Pulse Rate:  [70-82] 70 (09/01 0549) Resp:  [20] 20 (09/01 0549) BP: (157-192)/(72-80) 170/72 mmHg (09/01 0549) SpO2:  [92 %-99 %] 99 % (09/01 0549) Weight change:  Last BM Date: 08/28/13  Intake/Output from previous day: 08/31 0701 - 09/01 0700 In: 2472.5 [I.V.:1772.5; IV Piggyback:700] Out: 3400 [Urine:3400]  PHYSICAL EXAM General appearance: alert and no distress Resp: clear to auscultation bilaterally Cardio: S1, S2 normal GI: diffuse tenderness of the lower quadrants, bowel sound ++ Extremities: extremities normal, atraumatic, no cyanosis or edema  Lab Results:    @labtest @ [facility] No results found for this basename: PHART, PCO2, PO2ART, TCO2, HCO3,  in the last 72 hours CULTURES No results found for this or any previous visit (from the past 240 hour(s)). Studies/Results: No results found.  Medications: I have reviewed the patient's current medications.  Assesment: Principal Problem:   Diverticulitis Active Problems:   HYPOTHYROIDISM   HYPERLIPIDEMIA   HYPERTENSION,    DIVERTICULAR DISEASE   Depression   Insulin dependent diabetes mellitus   GERD (gastroesophageal reflux disease)   Nausea   Back pain   Leukocytosis    Plan: Medications reviewed Continue iv antibiotics Advance diet to regular low carb diet.  LOS: 5 days   Shanty Ginty 08/29/2013, 9:02 AM

## 2013-08-30 DIAGNOSIS — K5732 Diverticulitis of large intestine without perforation or abscess without bleeding: Secondary | ICD-10-CM

## 2013-08-30 MED ORDER — METRONIDAZOLE 500 MG PO TABS: 500.0000 mg | ORAL_TABLET | Freq: Three times a day (TID) | ORAL | Status: DC

## 2013-08-30 MED ORDER — CIPROFLOXACIN HCL 500 MG PO TABS: 500.0000 mg | ORAL_TABLET | Freq: Two times a day (BID) | ORAL | Status: DC

## 2013-08-30 NOTE — Progress Notes (Signed)
UR chart review completed.  

## 2013-08-30 NOTE — Progress Notes (Signed)
Subjective:  Feels better. Going home.  Objective: Vital signs in last 24 hours: Temp:  [98.3 F (36.8 C)] 98.3 F (36.8 C) (09/02 0556) Pulse Rate:  [64-77] 64 (09/02 0556) Resp:  [18-20] 18 (09/02 0556) BP: (170-183)/(72-85) 170/72 mmHg (09/02 0556) SpO2:  [99 %] 99 % (09/02 0556) Last BM Date: 08/29/13 General:   Alert,  Well-developed, well-nourished, pleasant and cooperative in NAD Head:  Normocephalic and atraumatic. Eyes:  Sclera clear, no icterus.   Abdomen:  Soft. Neurologic:  Alert and  oriented x4;  grossly normal neurologically. Skin:  Intact without significant lesions or rashes. Psych:  Alert and cooperative. Normal mood and affect.  Intake/Output from previous day: 09/01 0701 - 09/02 0700 In: 1362.5 [P.O.:270; I.V.:992.5; IV Piggyback:100] Out: 3375 [Urine:3375] Intake/Output this shift:       Assessment: Acute uncomplicated diverticulitis. Resolving.    Plan: 1. Agree with discharge. Patient to call our office if any further problems. Routine f/u of GI issues as per previous plan.    LOS: 6 days   Tana Coast  08/30/2013, 8:03 AM

## 2013-08-30 NOTE — Discharge Summary (Signed)
Physician Discharge Summary  Patient ID: [name] MRN: [id number] DOB/AGE: [birth year] 62 y.o. Primary Care Physician:Greenwood, KAWANTA, MD Admit date: 08/24/2013 Discharge date: 08/30/2013    Discharge Diagnoses:  Principal Problem:   Diverticulitis Active Problems:   HYPOTHYROIDISM   HYPERLIPIDEMIA   HYPERTENSION,    DIVERTICULAR DISEASE   Depression   Insulin dependent diabetes mellitus   GERD (gastroesophageal reflux disease)   Nausea   Back pain   Leukocytosis     Medication List         ciprofloxacin 500 MG tablet  Commonly known as:  CIPRO  Take 1 tablet (500 mg total) by mouth 2 (two) times daily.     citalopram 40 MG tablet  Commonly known as:  CELEXA  Take 0.5 tablets (20 mg total) by mouth daily.     CRANBERRY EXTRACT PO  Take 84 mg by mouth 2 (two) times daily.     diphenoxylate-atropine 2.5-0.025 MG per tablet  Commonly known as:  LOMOTIL  Take 1 tablet by mouth 4 (four) times daily as needed.     Fish Oil 1200 MG Caps  Take 2 capsules by mouth 2 (two) times daily.     HYDROcodone-acetaminophen 5-325 MG per tablet  Commonly known as:  NORCO/VICODIN  Take 0.5 tablets by mouth every 6 (six) hours as needed for pain.     insulin aspart 100 UNIT/ML injection  Commonly known as:  novoLOG  Inject 36 Units into the skin 3 (three) times daily before meals. Based on sliding scale     insulin detemir 100 UNIT/ML injection  Commonly known as:  LEVEMIR  Inject 70-80 Units into the skin 2 (two) times daily. 70units in AM and 80 units in pm     levothyroxine 75 MCG tablet  Commonly known as:  SYNTHROID, LEVOTHROID  Take 75 mcg by mouth at bedtime.     lisinopril 40 MG tablet  Commonly known as:  PRINIVIL,ZESTRIL  Take 1 tablet (40 mg total) by mouth daily.     metroNIDAZOLE 500 MG tablet  Commonly known as:  FLAGYL  Take 1 tablet (500 mg total) by mouth every 8 (eight) hours.     multivitamin tablet  Take 1 tablet by mouth daily.      omeprazole 20 MG capsule  Commonly known as:  PRILOSEC  Take 1 capsule (20 mg total) by mouth 2 (two) times daily before a meal.     verapamil 120 MG tablet  Commonly known as:  CALAN  Take 1 tablet (120 mg total) by mouth 2 (two) times daily.     vitamin C 500 MG tablet  Commonly known as:  ASCORBIC ACID  Take 500 mg by mouth daily.     Vitamin D 2000 UNITS tablet  Take 2,000 Units by mouth daily.     zolpidem 10 MG tablet  Commonly known as:  AMBIEN  Take 1 tablet (10 mg total) by mouth at bedtime as needed.        Discharged Condition: improved    Consults: GI  Significant Diagnostic Studies: Ct Abdomen Pelvis W Contrast  08/24/2013   *RADIOLOGY REPORT*  Clinical Data:  Left lower quadrant pain.  History of diverticulitis.  CT ABDOMEN AND PELVIS WITH CONTRAST  Technique:  Multidetector CT imaging of the abdomen and pelvis was performed using the standard protocol following bolus administration of intravenous contrast.  Contrast: 50mL OMNIPAQUE IOHEXOL 300 MG/ML  SOLN, OMNIPAQUE IOHEXOL 300 MG/ML  SOLN  Comparison:  05/20/2010.  Findings:  Lung bases show no acute findings.  Heart is at the upper limits of normal in size.  Mitral annulus calcification.  No pericardial or pleural effusion.  Pre pericardiac lymph nodes are sub centimeter in short axis size.  Liver is decreased attenuation diffusely.  Liver, gallbladder and adrenal glands are otherwise unremarkable.  7 mm low attenuation lesion in the interpolar right kidney is unchanged from 05/20/2010 and most likely a cyst.  Kidneys are otherwise unremarkable.  11 mm low attenuation lesion in the spleen is unchanged.  The spleen, pancreas, stomach, small bowel and proximal colon are otherwise unremarkable.  There is thickening and mild haziness involving the distal descending/proximal sigmoid colon (series 2, image 67).  No associated fluid collection or extraluminal air.  Hysterectomy.  Ovaries are visualized.  Atherosclerotic  calcification of the arterial vasculature without abdominal aortic aneurysm.  No pathologically enlarged lymph nodes.  No free fluid. No worrisome lytic or sclerotic lesions.  Degenerative changes are seen in the spine and left hip.  IMPRESSION:  1.  Diverticulitis involving the distal descending/proximal sigmoid colon.  No evidence of abscess or perforation. 2.  Hepatomegaly.   Original Report Authenticated By: Leanna Battles, M.D.    Lab Results: Basic Metabolic Panel: No results found for this basename: NA, K, CL, CO2, GLUCOSE, BUN, CREATININE, CALCIUM, MG, PHOS,  in the last 72 hours Liver Function Tests: No results found for this basename: AST, ALT, ALKPHOS, BILITOT, PROT, ALBUMIN,  in the last 72 hours   CBC: No results found for this basename: WBC, NEUTROABS, HGB, HCT, MCV, PLT,  in the last 72 hours  No results found for this or any previous visit (from the past 240 hour(s)).   Hospital Course:  This is a 62 years old female with history of multiple medical illnesses was admitted due to diverticulitis. She was initially treated with combination IV antibiotics which was later changed to oral antibiotics. She was evaluated. Patient improved and was able to tolerate regular diet. She is being discharged on oral antibiotics to be followed in the office.  Discharge Exam: Blood pressure 170/72, pulse 64, temperature 98.3 F (36.8 C), temperature source Oral, resp. rate 18, height 4\' 10"  (1.473 m), weight 51.347 kg (113 lb 3.2 oz), SpO2 99.00%.   Disposition:  Home        Follow-up Information   Follow up with [facility], MD In 1 week.   Specialty:  Internal Medicine   Contact information:   [address] [place] [address] [phone]       Signed: Avon Gully  08/30/2013, 8:11 AM

## 2013-08-30 NOTE — Progress Notes (Signed)
Patient states understanding of discharge instructions.  

## 2013-09-14 ENCOUNTER — Other Ambulatory Visit (HOSPITAL_COMMUNITY): Payer: Self-pay | Admitting: Orthopaedic Surgery

## 2013-09-14 DIAGNOSIS — R252 Cramp and spasm: Secondary | ICD-10-CM

## 2013-09-14 DIAGNOSIS — I714 Abdominal aortic aneurysm, without rupture: Secondary | ICD-10-CM

## 2013-09-14 DIAGNOSIS — R52 Pain, unspecified: Secondary | ICD-10-CM

## 2013-09-16 ENCOUNTER — Ambulatory Visit (HOSPITAL_COMMUNITY)
Admission: RE | Admit: 2013-09-16 | Discharge: 2013-09-16 | Disposition: A | Discharge status: Home / Self Care | Payer: Medicare Other | Source: Ambulatory Visit | Attending: Orthopaedic Surgery | Admitting: Orthopaedic Surgery

## 2013-09-16 DIAGNOSIS — M659 Unspecified synovitis and tenosynovitis, unspecified site: Secondary | ICD-10-CM | POA: Insufficient documentation

## 2013-09-16 DIAGNOSIS — R52 Pain, unspecified: Secondary | ICD-10-CM

## 2013-09-16 DIAGNOSIS — I714 Abdominal aortic aneurysm, without rupture: Secondary | ICD-10-CM

## 2013-09-16 DIAGNOSIS — R9389 Abnormal findings on diagnostic imaging of other specified body structures: Secondary | ICD-10-CM | POA: Insufficient documentation

## 2013-09-16 DIAGNOSIS — M1610 Unilateral primary osteoarthritis, unspecified hip: Secondary | ICD-10-CM | POA: Insufficient documentation

## 2013-09-16 DIAGNOSIS — M25559 Pain in unspecified hip: Secondary | ICD-10-CM | POA: Insufficient documentation

## 2013-09-16 DIAGNOSIS — M169 Osteoarthritis of hip, unspecified: Secondary | ICD-10-CM | POA: Insufficient documentation

## 2013-09-16 DIAGNOSIS — R252 Cramp and spasm: Secondary | ICD-10-CM

## 2013-09-21 ENCOUNTER — Other Ambulatory Visit (HOSPITAL_COMMUNITY): Payer: Self-pay | Admitting: Orthopaedic Surgery

## 2013-09-21 DIAGNOSIS — N949 Unspecified condition associated with female genital organs and menstrual cycle: Secondary | ICD-10-CM

## 2013-09-23 ENCOUNTER — Ambulatory Visit (HOSPITAL_COMMUNITY): Payer: Medicare Other

## 2013-09-27 ENCOUNTER — Ambulatory Visit (HOSPITAL_COMMUNITY): Payer: Medicare Other

## 2013-09-27 ENCOUNTER — Ambulatory Visit (HOSPITAL_COMMUNITY)
Admission: RE | Admit: 2013-09-27 | Discharge: 2013-09-27 | Disposition: A | Discharge status: Home / Self Care | Payer: Medicare Other | Source: Ambulatory Visit | Attending: Orthopaedic Surgery | Admitting: Orthopaedic Surgery

## 2013-09-27 DIAGNOSIS — Z90710 Acquired absence of both cervix and uterus: Secondary | ICD-10-CM | POA: Insufficient documentation

## 2013-09-27 DIAGNOSIS — N949 Unspecified condition associated with female genital organs and menstrual cycle: Secondary | ICD-10-CM

## 2013-09-27 DIAGNOSIS — N83209 Unspecified ovarian cyst, unspecified side: Secondary | ICD-10-CM | POA: Insufficient documentation

## 2013-10-13 ENCOUNTER — Encounter (HOSPITAL_COMMUNITY): Payer: Self-pay | Admitting: Pharmacy Technician

## 2013-10-17 ENCOUNTER — Encounter (HOSPITAL_COMMUNITY)
Admission: RE | Admit: 2013-10-17 | Discharge: 2013-10-17 | Disposition: A | Discharge status: Home / Self Care | Payer: Medicare Other | Source: Ambulatory Visit | Attending: Orthopedic Surgery | Admitting: Orthopedic Surgery

## 2013-10-17 ENCOUNTER — Encounter (HOSPITAL_COMMUNITY)
Admission: RE | Admit: 2013-10-17 | Discharge: 2013-10-17 | Disposition: A | Discharge status: Home / Self Care | Payer: Medicare Other | Source: Ambulatory Visit | Attending: Orthopaedic Surgery | Admitting: Orthopaedic Surgery

## 2013-10-17 ENCOUNTER — Encounter (HOSPITAL_COMMUNITY): Payer: Self-pay

## 2013-10-17 DIAGNOSIS — Z01810 Encounter for preprocedural cardiovascular examination: Secondary | ICD-10-CM | POA: Insufficient documentation

## 2013-10-17 DIAGNOSIS — Z01818 Encounter for other preprocedural examination: Secondary | ICD-10-CM | POA: Insufficient documentation

## 2013-10-17 DIAGNOSIS — Z01812 Encounter for preprocedural laboratory examination: Secondary | ICD-10-CM | POA: Insufficient documentation

## 2013-10-17 HISTORY — DX: Other specified postprocedural states: Z98.890

## 2013-10-17 HISTORY — DX: Other specified postprocedural states: R11.2

## 2013-10-17 HISTORY — DX: Frequency of micturition: R35.0

## 2013-10-17 LAB — URINALYSIS, ROUTINE W REFLEX MICROSCOPIC
Bilirubin Urine: NEGATIVE
Glucose, UA: NEGATIVE mg/dL
Ketones, ur: NEGATIVE mg/dL
Nitrite: NEGATIVE
Protein, ur: NEGATIVE mg/dL
Urobilinogen, UA: 0.2 mg/dL (ref 0.0–1.0)

## 2013-10-17 LAB — PROTIME-INR
INR: 1.01 (ref 0.00–1.49)
Prothrombin Time: 13.1 seconds (ref 11.6–15.2)

## 2013-10-17 LAB — SURGICAL PCR SCREEN
MRSA, PCR: NEGATIVE
Staphylococcus aureus: NEGATIVE

## 2013-10-17 LAB — TYPE AND SCREEN: Antibody Screen: NEGATIVE

## 2013-10-17 LAB — COMPREHENSIVE METABOLIC PANEL
ALT: 28 U/L (ref 0–35)
Albumin: 3.9 g/dL (ref 3.5–5.2)
BUN: 17 mg/dL (ref 6–23)
CO2: 29 mEq/L (ref 19–32)
Creatinine, Ser: 0.86 mg/dL (ref 0.50–1.10)
GFR calc Af Amer: 81 mL/min — ABNORMAL LOW (ref >90)
Potassium: 4 mEq/L (ref 3.5–5.1)
Total Protein: 7.6 g/dL (ref 6.0–8.3)

## 2013-10-17 LAB — CBC
HCT: 37.9 % (ref 36.0–46.0)
MCHC: 35.1 g/dL (ref 30.0–36.0)
MCV: 83.8 fL (ref 78.0–100.0)
Platelets: 207 10*3/uL (ref 150–400)
RBC: 4.52 MIL/uL (ref 3.87–5.11)
RDW: 12.5 % (ref 11.5–15.5)

## 2013-10-17 LAB — APTT: aPTT: 28 seconds (ref 24–37)

## 2013-10-17 NOTE — Pre-Procedure Instructions (Signed)
[name]  10/17/2013   Your procedure is scheduled on:  10-25-2013  Tuesday   Report to [facility]  2 * 3 at 11:30 AM.  Call this number if you have problems the morning of surgery: [phone]   Remember:   Do not eat food or drink liquids after midnight.    Take these medicines the morning of surgery with A SIP OF WATER: citalopram(Celexa),levothyroxine(Synthroid),omeprazole(Prilosec),               DO NOT TAKE ANY INSULIN THE MORNING OF SURGERY   Do not wear jewelry, make-up or nail polish.  Do not wear lotions, powders, or perfumes.   Do not shave 48 hours prior to surgery.   Do not bring valuables to the hospital.  [facility] is not responsible for any belongings or valuables.               Contacts, dentures or bridgework may not be worn into surgery.   Leave suitcase in the car. After surgery it may be brought to your room.  For patients admitted to the hospital, discharge time is determined by your  treatment team.               Patients discharged the day of surgery will not be allowed to drive home.  Special l Instructions: Shower using CHG 2 nights before surgery and the night before surgery.  If you shower the day of surgery use CHG.  Use special wash - you have one bottle of CHG for all showers.  You should use approximately 1/3 of the bottle for each shower.   Please read over the following fact sheets that you were given: Pain Booklet, Coughing and Deep Breathing, Blood Transfusion Information and Surgical Site Infection Prevention

## 2013-10-18 LAB — URINE CULTURE: Colony Count: 35000

## 2013-10-21 NOTE — H&P (Signed)
CHIEF COMPLAINT:  Painful left hip.    HISTORY OF PRESENT ILLNESS:  [name] is a very pleasant 62 year old white female who is seen today for evaluation of her left hip pain.  In April of this year, she started having pain in the groin and up into the pelvic area, which she would try to get up and move her leg.  It also started having some cramping in the anterior thigh down to the knee.  There were times when her hip would lock up on her.  She also has some pain in the lower lumbar spine but this is not the major symptom.  She had worsened to the point where she was using a cane as well as heat and hydrocodone and aspirin.  She had difficulty with sleeping, standing, sitting, walking.  It has become progressive.  She was seen, noted to have some vascular disease mainly in the aorta and iliac arteries.  Because of her discomfort, an MRI scan was ordered of her pelvis and also some vascular studies were obtained.  The MRI revealed marked inflammatory changes with the left hip with effusion and synovitis.  She has had severe osteoarthritis and degenerative change with no evidence of avascular necrosis.  There was a 25 mm septated cystic lesion in the left pelvis.  There was calcification similar to a remote CT scan, 05/08/2013.  A pelvic ultrasound was suggested and ordered appropriately.  Her vascular studies did show borderline elevated ABI within the left lower extremity most commonly associated with small vessel disease related to her diabetes.  The ultrasound of the abdominal aorta was without evidence of aneurysm.  Calcification noted in the abdominal aorta and the proximal common iliac arteries was noted.  A pelvic ultrasound and transvaginal ultrasound of the pelvis were obtained.  It appeared to have normal blood flow and no masses in both ovaries.  There was a lot of cystic structure within the ovary.  No definite fat density but there was a possibility that she could have a dermoid cyst, and if there  was a case, it is probably that of a malignant transformation.  This was reviewed by Dr. Cleophas Dunker and it was felt that the hip was more of her symptoms.  She returns today for review and requesting total hip replacement.     Her health history:     SURGICAL INTERVENTIONS: 1.    Tonsillectomy 1956. 2.    Goiter excision of just the cyst 1969. 3.    Left ovarian cyst removal 1971. 4.    Right elbow ulnar nerve transposition 1974. 5.    D&C 1980. 6.    Lipoma removal x2 of the abdomen 1984. 7.    Partial hysterectomy and bladder tack 1997. 8.    Carpal tunnel releases bilaterally 2006. 9.    Left elbow ulnar nerve transposition 2009. 10.  Right hand middle finger joint replacement, that of the PIP joint, 2010. 11.  Left cataract excision 2013. 12.  Right cataract excision 2014.   HOSPITALIZATIONS WITHOUT SURGERY: 1.    Influenza 1961.  2.    Miscarriage 1972.     3.    Back and hip pain 1974. 4.    Miscarriage 1976. 5.    Miscarriage 1980. 6.    Diverticulitis 2009. 7.    Diverticulitis 2014.   MEDICATIONS:   1.    Lisinopril 40 mg daily. 2.    Novolog sliding scale 35 units. 3.    Levemir 70 units in  the morning, 80 units at night. 4.    Verapamil 120 mg twice a day.  5.    Levothyroxine 75 mcg daily.  6.    Omeprazole 20 mg b.i.d.  7.    Citalopram 40 mg 1/2 tablet daily.  8.    Zolpidem 10 mg 1/2 tablet at bedtime. 9.    Invokana 100 mg 1 daily.  10.  Cranberry fruit 4200 mg twice a day.  11.  Hydrocodone 5/325 every 4 hours. 12.  Aspirin 81 mg a day.  13.  Multivitamins 1 daily. 14.  Vitamin C 500 daily. 15.  Vitamin D3 at 2000 mg daily. 16.  B complex daily. 17.  Fish oil 1200 mg 2 twice a day.    ALLERGIES:  Demerol, which causes her vomiting, and penicillin, which causes a rash.     REVIEW OF SYSTEMS:  A 14-point review of systems positive for cataract extractions and glasses.  She has upper and lower partials.  She has had hypertension since 1998.  She did have  an ulcer once.  Sporadic diarrhea.  Hemorrhoids are noted on her review.  She has had diabetes since 1997, has been taking insulin since 1998.  She does have a history of thyroid goiter, which apparently has been excised, and she is on levothyroxine.  She also has nocturia 2-3 times in the evening.  She is diabetic.     FAMILY HISTORY:  Positive for a mother who died at 14 years of age from diabetes mellitus.  She has had hypertension, diabetes, strokes.  Father died at age 77 from a myocardial infarction.  He had heart disease and heart attack as well as gout.  She has a living brother at age 7 with diabetes.  No sisters.     SOCIAL HISTORY:  A 13 year old white female who denies use of tobacco or alcohol.  She is widowed and retired from [employer].     PHYSICAL EXAMINATION:  A 62 year old white female, well developed, well nourished, alert, pleasant, cooperate, in moderate distress secondary to left hip pain.  She is 5 feet.  She weighs 109 pounds.  BMI is 21.3.   Vital signs:  Reveal temperature 98.6, pulse 82, respirations 14, blood pressure 135/69.   Head:  Normocephalic.   Eyes:  Pupils equal, round, reactive to light and accommodation.  Extraocular movements intact. Ears, nose and throat:  Benign.  Neck:  Supple.  No bruits noted. Chest:  Good expansion. Lungs:  Essentially clear. Cardiac:  Had a regular rhythm and rate.  Normal S1 and S2.  No discrete murmur was noted.   Pulses:  Pulses were 1+, bilateral and symmetric, in the posterior tibialis bilaterally.   CNS:  She is oriented x3.  Cranial nerves II-XII grossly intact.  Genitorectal and breast exam:  Not indicated for an orthopedic procedure. Musculoskeletal:  Today she has a very limited hip motion on the left.  She has pain with internal and external rotation as well as flexion.  Positive logroll.  She is neurovascularly intact distally.     CLINICAL IMPRESSION:   1.    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  2.     History of hypertension.   3.    Insulin dependent diabetes mellitus with glucoses over 400.   4.    History of goiter with excision and placement on levothyroxine.    RECOMMENDATIONS:  At this time, I have reviewed clearance forms from Dr. Fransico Him and Dr. Felecia Shelling,  who feels that she is a candidate for surgical intervention.  Therefore, the procedure risks and benefits were fully explained to her and she is understanding and would like to proceed with surgery in the very near future.  All questions were answered in detail.     Oris Drone Aleda Grana [facility] [phone]  10/21/2013 2:07 PM

## 2013-10-24 MED ORDER — VANCOMYCIN HCL IN DEXTROSE 1-5 GM/200ML-% IV SOLN
1000.0000 mg | INTRAVENOUS | Status: AC
  Administered 2013-10-25: 1000 mg via INTRAVENOUS
  Filled 2013-10-24: qty 200

## 2013-10-24 MED ORDER — CHLORHEXIDINE GLUCONATE 4 % EX LIQD: 60.0000 mL | Freq: Once | CUTANEOUS | Status: DC

## 2013-10-24 MED ORDER — ACETAMINOPHEN 500 MG PO TABS
1000.0000 mg | ORAL_TABLET | Freq: Once | ORAL | Status: AC
  Administered 2013-10-25: 1000 mg via ORAL
  Filled 2013-10-24: qty 2

## 2013-10-24 NOTE — Progress Notes (Signed)
Patient notified of new arrival time of 1100

## 2013-10-25 ENCOUNTER — Encounter (HOSPITAL_COMMUNITY)
Admission: RE | Disposition: A | Discharge status: Skilled Nursing Facility | Payer: Self-pay | Source: Ambulatory Visit | Attending: Orthopaedic Surgery

## 2013-10-25 ENCOUNTER — Encounter (HOSPITAL_COMMUNITY): Payer: Medicare Other | Admitting: Certified Registered Nurse Anesthetist

## 2013-10-25 ENCOUNTER — Inpatient Hospital Stay (HOSPITAL_COMMUNITY)
Admission: RE | Admit: 2013-10-25 | Discharge: 2013-10-28 | DRG: 470 | Disposition: A | Discharge status: Skilled Nursing Facility | Payer: Medicare Other | Source: Ambulatory Visit | Attending: Orthopaedic Surgery | Admitting: Orthopaedic Surgery

## 2013-10-25 ENCOUNTER — Inpatient Hospital Stay (HOSPITAL_COMMUNITY): Payer: Medicare Other | Admitting: Certified Registered Nurse Anesthetist

## 2013-10-25 ENCOUNTER — Inpatient Hospital Stay (HOSPITAL_COMMUNITY): Payer: Medicare Other

## 2013-10-25 ENCOUNTER — Encounter (HOSPITAL_COMMUNITY): Payer: Self-pay | Admitting: *Deleted

## 2013-10-25 DIAGNOSIS — M169 Osteoarthritis of hip, unspecified: Principal | ICD-10-CM | POA: Diagnosis present

## 2013-10-25 DIAGNOSIS — I10 Essential (primary) hypertension: Secondary | ICD-10-CM | POA: Diagnosis present

## 2013-10-25 DIAGNOSIS — Z7901 Long term (current) use of anticoagulants: Secondary | ICD-10-CM

## 2013-10-25 DIAGNOSIS — Z96698 Presence of other orthopedic joint implants: Secondary | ICD-10-CM

## 2013-10-25 DIAGNOSIS — Z880 Allergy status to penicillin: Secondary | ICD-10-CM

## 2013-10-25 DIAGNOSIS — Z823 Family history of stroke: Secondary | ICD-10-CM

## 2013-10-25 DIAGNOSIS — E785 Hyperlipidemia, unspecified: Secondary | ICD-10-CM | POA: Diagnosis present

## 2013-10-25 DIAGNOSIS — Z8249 Family history of ischemic heart disease and other diseases of the circulatory system: Secondary | ICD-10-CM

## 2013-10-25 DIAGNOSIS — M6580 Other synovitis and tenosynovitis, unspecified site: Secondary | ICD-10-CM | POA: Diagnosis present

## 2013-10-25 DIAGNOSIS — D62 Acute posthemorrhagic anemia: Secondary | ICD-10-CM | POA: Diagnosis not present

## 2013-10-25 DIAGNOSIS — Z794 Long term (current) use of insulin: Secondary | ICD-10-CM

## 2013-10-25 DIAGNOSIS — Z833 Family history of diabetes mellitus: Secondary | ICD-10-CM

## 2013-10-25 DIAGNOSIS — M549 Dorsalgia, unspecified: Secondary | ICD-10-CM

## 2013-10-25 DIAGNOSIS — Z8349 Family history of other endocrine, nutritional and metabolic diseases: Secondary | ICD-10-CM

## 2013-10-25 DIAGNOSIS — F3289 Other specified depressive episodes: Secondary | ICD-10-CM | POA: Diagnosis present

## 2013-10-25 DIAGNOSIS — K219 Gastro-esophageal reflux disease without esophagitis: Secondary | ICD-10-CM | POA: Diagnosis present

## 2013-10-25 DIAGNOSIS — Z96649 Presence of unspecified artificial hip joint: Secondary | ICD-10-CM

## 2013-10-25 DIAGNOSIS — E871 Hypo-osmolality and hyponatremia: Secondary | ICD-10-CM | POA: Diagnosis present

## 2013-10-25 DIAGNOSIS — E119 Type 2 diabetes mellitus without complications: Secondary | ICD-10-CM

## 2013-10-25 DIAGNOSIS — M1610 Unilateral primary osteoarthritis, unspecified hip: Principal | ICD-10-CM | POA: Diagnosis present

## 2013-10-25 DIAGNOSIS — E039 Hypothyroidism, unspecified: Secondary | ICD-10-CM | POA: Diagnosis present

## 2013-10-25 DIAGNOSIS — Z79899 Other long term (current) drug therapy: Secondary | ICD-10-CM

## 2013-10-25 DIAGNOSIS — F329 Major depressive disorder, single episode, unspecified: Secondary | ICD-10-CM | POA: Diagnosis present

## 2013-10-25 DIAGNOSIS — Z23 Encounter for immunization: Secondary | ICD-10-CM

## 2013-10-25 DIAGNOSIS — IMO0001 Reserved for inherently not codable concepts without codable children: Secondary | ICD-10-CM | POA: Diagnosis present

## 2013-10-25 DIAGNOSIS — M159 Polyosteoarthritis, unspecified: Secondary | ICD-10-CM

## 2013-10-25 DIAGNOSIS — R509 Fever, unspecified: Secondary | ICD-10-CM | POA: Diagnosis present

## 2013-10-25 HISTORY — PX: TOTAL HIP ARTHROPLASTY: SHX124

## 2013-10-25 LAB — BASIC METABOLIC PANEL
BUN: 30 mg/dL — ABNORMAL HIGH (ref 6–23)
CO2: 20 mEq/L (ref 19–32)
Calcium: 8.5 mg/dL (ref 8.4–10.5)
Chloride: 100 mEq/L (ref 96–112)
Creatinine, Ser: 1.02 mg/dL (ref 0.50–1.10)
GFR calc Af Amer: 66 mL/min — ABNORMAL LOW (ref >90)
GFR calc non Af Amer: 57 mL/min — ABNORMAL LOW (ref >90)

## 2013-10-25 LAB — GLUCOSE, CAPILLARY
Glucose-Capillary: 237 mg/dL — ABNORMAL HIGH (ref 70–99)
Glucose-Capillary: 327 mg/dL — ABNORMAL HIGH (ref 70–99)

## 2013-10-25 LAB — CBC
HCT: 29.7 % — ABNORMAL LOW (ref 36.0–46.0)
MCH: 30.4 pg (ref 26.0–34.0)
MCHC: 36.4 g/dL — ABNORMAL HIGH (ref 30.0–36.0)
Platelets: 212 10*3/uL (ref 150–400)
RDW: 12.8 % (ref 11.5–15.5)
WBC: 19.8 10*3/uL — ABNORMAL HIGH (ref 4.0–10.5)

## 2013-10-25 SURGERY — ARTHROPLASTY, HIP, TOTAL,POSTERIOR APPROACH
Anesthesia: General | Site: Hip | Laterality: Left | Wound class: Clean

## 2013-10-25 MED ORDER — LEVOTHYROXINE SODIUM 75 MCG PO TABS
75.0000 ug | ORAL_TABLET | Freq: Every day | ORAL | Status: DC
  Filled 2013-10-25: qty 1

## 2013-10-25 MED ORDER — GLYCOPYRROLATE 0.2 MG/ML IJ SOLN
INTRAMUSCULAR | Status: DC | PRN
  Administered 2013-10-25: 0.6 mg via INTRAVENOUS

## 2013-10-25 MED ORDER — INSULIN ASPART 100 UNIT/ML ~~LOC~~ SOLN: 5.0000 [IU] | Freq: Three times a day (TID) | SUBCUTANEOUS | Status: DC

## 2013-10-25 MED ORDER — PROPOFOL 10 MG/ML IV BOLUS
INTRAVENOUS | Status: DC | PRN
  Administered 2013-10-25: 100 mg via INTRAVENOUS

## 2013-10-25 MED ORDER — LISINOPRIL 40 MG PO TABS
40.0000 mg | ORAL_TABLET | Freq: Every day | ORAL | Status: DC
  Administered 2013-10-25 – 2013-10-28 (×2): 40 mg via ORAL
  Filled 2013-10-25 (×4): qty 1

## 2013-10-25 MED ORDER — BISACODYL 10 MG RE SUPP: 10.0000 mg | Freq: Every day | RECTAL | Status: DC | PRN

## 2013-10-25 MED ORDER — INSULIN ASPART 100 UNIT/ML ~~LOC~~ SOLN: 0.0000 [IU] | Freq: Every day | SUBCUTANEOUS | Status: DC

## 2013-10-25 MED ORDER — OXYCODONE HCL 5 MG PO TABS
5.0000 mg | ORAL_TABLET | ORAL | Status: DC | PRN
Start: 2013-10-25 — End: 2013-10-28
  Administered 2013-10-25 – 2013-10-26 (×4): 10 mg via ORAL
  Administered 2013-10-27 – 2013-10-28 (×5): 5 mg via ORAL
  Filled 2013-10-25: qty 1
  Filled 2013-10-25: qty 2
  Filled 2013-10-25 (×2): qty 1
  Filled 2013-10-25: qty 2
  Filled 2013-10-25: qty 1
  Filled 2013-10-25 (×2): qty 2
  Filled 2013-10-25: qty 1

## 2013-10-25 MED ORDER — PANTOPRAZOLE SODIUM 40 MG PO TBEC
40.0000 mg | DELAYED_RELEASE_TABLET | Freq: Every day | ORAL | Status: DC
Start: 2013-10-26 — End: 2013-10-28
  Administered 2013-10-26 – 2013-10-28 (×3): 40 mg via ORAL
  Filled 2013-10-25 (×3): qty 1

## 2013-10-25 MED ORDER — NEOSTIGMINE METHYLSULFATE 1 MG/ML IJ SOLN
INTRAMUSCULAR | Status: DC | PRN
  Administered 2013-10-25: 1 mg via INTRAVENOUS
  Administered 2013-10-25: 4 mg via INTRAVENOUS

## 2013-10-25 MED ORDER — ONDANSETRON HCL 4 MG/2ML IJ SOLN: 4.0000 mg | Freq: Four times a day (QID) | INTRAMUSCULAR | Status: DC | PRN

## 2013-10-25 MED ORDER — ONDANSETRON HCL 4 MG PO TABS: 4.0000 mg | ORAL_TABLET | Freq: Four times a day (QID) | ORAL | Status: DC | PRN

## 2013-10-25 MED ORDER — INFLUENZA VAC SPLIT QUAD 0.5 ML IM SUSP
0.5000 mL | INTRAMUSCULAR | Status: AC
  Administered 2013-10-26: 0.5 mL via INTRAMUSCULAR
  Filled 2013-10-25: qty 0.5

## 2013-10-25 MED ORDER — DEXTROSE 5 % IV SOLN
INTRAVENOUS | Status: DC | PRN
  Administered 2013-10-25: 13:00:00 via INTRAVENOUS

## 2013-10-25 MED ORDER — DOCUSATE SODIUM 100 MG PO CAPS
100.0000 mg | ORAL_CAPSULE | Freq: Two times a day (BID) | ORAL | Status: DC
  Administered 2013-10-25 – 2013-10-28 (×6): 100 mg via ORAL
  Filled 2013-10-25 (×7): qty 1

## 2013-10-25 MED ORDER — MIDAZOLAM HCL 5 MG/5ML IJ SOLN
INTRAMUSCULAR | Status: DC | PRN
  Administered 2013-10-25: 2 mg via INTRAVENOUS

## 2013-10-25 MED ORDER — SODIUM CHLORIDE 0.9 % IV SOLN: INTRAVENOUS | Status: DC

## 2013-10-25 MED ORDER — METHOCARBAMOL 100 MG/ML IJ SOLN
500.0000 mg | Freq: Four times a day (QID) | INTRAVENOUS | Status: DC | PRN
  Administered 2013-10-25: 500 mg via INTRAVENOUS
  Filled 2013-10-25: qty 5

## 2013-10-25 MED ORDER — MAGNESIUM HYDROXIDE 400 MG/5ML PO SUSP: 30.0000 mL | Freq: Every day | ORAL | Status: DC | PRN

## 2013-10-25 MED ORDER — METOCLOPRAMIDE HCL 10 MG PO TABS: 5.0000 mg | ORAL_TABLET | Freq: Three times a day (TID) | ORAL | Status: DC | PRN

## 2013-10-25 MED ORDER — METOCLOPRAMIDE HCL 5 MG/ML IJ SOLN: 5.0000 mg | Freq: Three times a day (TID) | INTRAMUSCULAR | Status: DC | PRN

## 2013-10-25 MED ORDER — LACTATED RINGERS IV SOLN
INTRAVENOUS | Status: DC
  Administered 2013-10-25: 12:00:00 via INTRAVENOUS

## 2013-10-25 MED ORDER — LEVOTHYROXINE SODIUM 75 MCG PO TABS
75.0000 ug | ORAL_TABLET | Freq: Every day | ORAL | Status: DC
  Administered 2013-10-25 – 2013-10-26 (×2): 75 ug via ORAL
  Filled 2013-10-25 (×4): qty 1

## 2013-10-25 MED ORDER — EPHEDRINE SULFATE 50 MG/ML IJ SOLN
INTRAMUSCULAR | Status: DC | PRN
  Administered 2013-10-25 (×2): 5 mg via INTRAVENOUS

## 2013-10-25 MED ORDER — ONDANSETRON HCL 4 MG/2ML IJ SOLN
INTRAMUSCULAR | Status: DC | PRN
  Administered 2013-10-25: 4 mg via INTRAVENOUS

## 2013-10-25 MED ORDER — BUPIVACAINE-EPINEPHRINE PF 0.25-1:200000 % IJ SOLN
INTRAMUSCULAR | Status: AC
  Filled 2013-10-25: qty 30

## 2013-10-25 MED ORDER — ROCURONIUM BROMIDE 100 MG/10ML IV SOLN
INTRAVENOUS | Status: DC | PRN
  Administered 2013-10-25: 10 mg via INTRAVENOUS
  Administered 2013-10-25: 40 mg via INTRAVENOUS

## 2013-10-25 MED ORDER — HYDROMORPHONE HCL PF 1 MG/ML IJ SOLN: 0.5000 mg | INTRAMUSCULAR | Status: DC | PRN

## 2013-10-25 MED ORDER — INSULIN ASPART 100 UNIT/ML ~~LOC~~ SOLN
0.0000 [IU] | Freq: Three times a day (TID) | SUBCUTANEOUS | Status: DC
  Administered 2013-10-26: 7 [IU] via SUBCUTANEOUS
  Administered 2013-10-26: 4 [IU] via SUBCUTANEOUS
  Administered 2013-10-26: 7 [IU] via SUBCUTANEOUS
  Administered 2013-10-27 – 2013-10-28 (×4): 3 [IU] via SUBCUTANEOUS

## 2013-10-25 MED ORDER — VERAPAMIL HCL 120 MG PO TABS
120.0000 mg | ORAL_TABLET | Freq: Two times a day (BID) | ORAL | Status: DC
  Administered 2013-10-25 – 2013-10-28 (×4): 120 mg via ORAL
  Filled 2013-10-25 (×7): qty 1

## 2013-10-25 MED ORDER — PHENOL 1.4 % MT LIQD: 1.0000 | OROMUCOSAL | Status: DC | PRN

## 2013-10-25 MED ORDER — ACETAMINOPHEN 650 MG RE SUPP: 650.0000 mg | Freq: Four times a day (QID) | RECTAL | Status: DC | PRN

## 2013-10-25 MED ORDER — BUPIVACAINE-EPINEPHRINE PF 0.25-1:200000 % IJ SOLN
INTRAMUSCULAR | Status: DC | PRN
  Administered 2013-10-25: 20 mL

## 2013-10-25 MED ORDER — SODIUM CHLORIDE 0.9 % IV SOLN
75.0000 mL/h | INTRAVENOUS | Status: DC
  Administered 2013-10-25: 75 mL/h via INTRAVENOUS

## 2013-10-25 MED ORDER — RIVAROXABAN 10 MG PO TABS
10.0000 mg | ORAL_TABLET | ORAL | Status: DC
Start: 2013-10-26 — End: 2013-10-28
  Administered 2013-10-26 – 2013-10-27 (×2): 10 mg via ORAL
  Filled 2013-10-25 (×3): qty 1

## 2013-10-25 MED ORDER — ACETAMINOPHEN 325 MG PO TABS
650.0000 mg | ORAL_TABLET | Freq: Four times a day (QID) | ORAL | Status: DC | PRN
  Administered 2013-10-25 – 2013-10-27 (×4): 650 mg via ORAL
  Filled 2013-10-25 (×4): qty 2

## 2013-10-25 MED ORDER — INSULIN ASPART 100 UNIT/ML ~~LOC~~ SOLN
0.0000 [IU] | Freq: Every day | SUBCUTANEOUS | Status: DC
  Administered 2013-10-25: 2 [IU] via SUBCUTANEOUS

## 2013-10-25 MED ORDER — FENTANYL CITRATE 0.05 MG/ML IJ SOLN
INTRAMUSCULAR | Status: DC | PRN
  Administered 2013-10-25 (×4): 50 ug via INTRAVENOUS
  Administered 2013-10-25: 25 ug via INTRAVENOUS
  Administered 2013-10-25 (×2): 50 ug via INTRAVENOUS

## 2013-10-25 MED ORDER — CITALOPRAM HYDROBROMIDE 20 MG PO TABS
20.0000 mg | ORAL_TABLET | Freq: Every day | ORAL | Status: DC
  Administered 2013-10-26 – 2013-10-28 (×3): 20 mg via ORAL
  Filled 2013-10-25 (×3): qty 1

## 2013-10-25 MED ORDER — SODIUM CHLORIDE 0.9 % IR SOLN
Status: DC | PRN
  Administered 2013-10-25 (×2): 1000 mL

## 2013-10-25 MED ORDER — ZOLPIDEM TARTRATE 5 MG PO TABS: 5.0000 mg | ORAL_TABLET | Freq: Every evening | ORAL | Status: DC | PRN

## 2013-10-25 MED ORDER — KETOROLAC TROMETHAMINE 15 MG/ML IJ SOLN
7.5000 mg | Freq: Four times a day (QID) | INTRAMUSCULAR | Status: AC
  Administered 2013-10-25 – 2013-10-26 (×4): 7.5 mg via INTRAVENOUS
  Filled 2013-10-25 (×4): qty 1

## 2013-10-25 MED ORDER — HYDROMORPHONE HCL PF 1 MG/ML IJ SOLN
INTRAMUSCULAR | Status: AC
  Filled 2013-10-25: qty 1

## 2013-10-25 MED ORDER — CHLORHEXIDINE GLUCONATE 4 % EX LIQD: 60.0000 mL | Freq: Every day | CUTANEOUS | Status: DC

## 2013-10-25 MED ORDER — LACTATED RINGERS IV SOLN
INTRAVENOUS | Status: DC | PRN
  Administered 2013-10-25 (×2): via INTRAVENOUS

## 2013-10-25 MED ORDER — INSULIN DETEMIR 100 UNIT/ML ~~LOC~~ SOLN
80.0000 [IU] | Freq: Every day | SUBCUTANEOUS | Status: DC
  Administered 2013-10-25: 80 [IU] via SUBCUTANEOUS
  Filled 2013-10-25 (×3): qty 0.8

## 2013-10-25 MED ORDER — SCOPOLAMINE 1 MG/3DAYS TD PT72
MEDICATED_PATCH | TRANSDERMAL | Status: AC
  Administered 2013-10-25: 1.5 mg via TRANSDERMAL
  Filled 2013-10-25: qty 1

## 2013-10-25 MED ORDER — SCOPOLAMINE 1 MG/3DAYS TD PT72
1.0000 | MEDICATED_PATCH | TRANSDERMAL | Status: DC
  Administered 2013-10-25: 1.5 mg via TRANSDERMAL

## 2013-10-25 MED ORDER — INSULIN DETEMIR 100 UNIT/ML ~~LOC~~ SOLN
70.0000 [IU] | Freq: Every morning | SUBCUTANEOUS | Status: DC
  Administered 2013-10-26: 70 [IU] via SUBCUTANEOUS
  Filled 2013-10-25: qty 0.7

## 2013-10-25 MED ORDER — LIDOCAINE HCL (CARDIAC) 20 MG/ML IV SOLN
INTRAVENOUS | Status: DC | PRN
  Administered 2013-10-25: 60 mg via INTRAVENOUS

## 2013-10-25 MED ORDER — VITAMIN C 500 MG PO TABS
500.0000 mg | ORAL_TABLET | Freq: Two times a day (BID) | ORAL | Status: DC
  Administered 2013-10-25 – 2013-10-28 (×6): 500 mg via ORAL
  Filled 2013-10-25 (×7): qty 1

## 2013-10-25 MED ORDER — DEXAMETHASONE SODIUM PHOSPHATE 4 MG/ML IJ SOLN
INTRAMUSCULAR | Status: DC | PRN
  Administered 2013-10-25: 10 mg via INTRAVENOUS

## 2013-10-25 MED ORDER — INSULIN ASPART 100 UNIT/ML ~~LOC~~ SOLN
5.0000 [IU] | Freq: Three times a day (TID) | SUBCUTANEOUS | Status: DC
  Administered 2013-10-26 (×2): 5 [IU] via SUBCUTANEOUS

## 2013-10-25 MED ORDER — METHOCARBAMOL 500 MG PO TABS
500.0000 mg | ORAL_TABLET | Freq: Four times a day (QID) | ORAL | Status: DC | PRN
  Administered 2013-10-25 – 2013-10-28 (×4): 500 mg via ORAL
  Filled 2013-10-25 (×6): qty 1

## 2013-10-25 MED ORDER — VANCOMYCIN HCL IN DEXTROSE 1-5 GM/200ML-% IV SOLN
1000.0000 mg | Freq: Two times a day (BID) | INTRAVENOUS | Status: AC
  Administered 2013-10-25: 1000 mg via INTRAVENOUS
  Filled 2013-10-25: qty 200

## 2013-10-25 MED ORDER — ALUM & MAG HYDROXIDE-SIMETH 200-200-20 MG/5ML PO SUSP: 30.0000 mL | ORAL | Status: DC | PRN

## 2013-10-25 MED ORDER — FLEET ENEMA 7-19 GM/118ML RE ENEM: 1.0000 | ENEMA | Freq: Once | RECTAL | Status: AC | PRN

## 2013-10-25 MED ORDER — INSULIN ASPART 100 UNIT/ML ~~LOC~~ SOLN
0.0000 [IU] | Freq: Three times a day (TID) | SUBCUTANEOUS | Status: DC
  Administered 2013-10-25: 15 [IU] via SUBCUTANEOUS

## 2013-10-25 MED ORDER — HYDROMORPHONE HCL PF 1 MG/ML IJ SOLN
0.2500 mg | INTRAMUSCULAR | Status: DC | PRN
  Administered 2013-10-25 (×2): 0.5 mg via INTRAVENOUS

## 2013-10-25 MED ORDER — ESMOLOL HCL 10 MG/ML IV SOLN
INTRAVENOUS | Status: DC | PRN
  Administered 2013-10-25 (×2): 20 mg via INTRAVENOUS

## 2013-10-25 MED ORDER — MENTHOL 3 MG MT LOZG: 1.0000 | LOZENGE | OROMUCOSAL | Status: DC | PRN

## 2013-10-25 SURGICAL SUPPLY — 64 items
BLADE SAW SAG 73X25 THK (BLADE) ×1
BLADE SAW SGTL 73X25 THK (BLADE) ×1 IMPLANT
BRUSH FEMORAL CANAL (MISCELLANEOUS) IMPLANT
CAPT HIP PF MOP ×2 IMPLANT
CLOTH BEACON ORANGE TIMEOUT ST (SAFETY) ×2 IMPLANT
COVER BACK TABLE 24X17X13 BIG (DRAPES) IMPLANT
COVER SURGICAL LIGHT HANDLE (MISCELLANEOUS) ×2 IMPLANT
DRAPE INCISE IOBAN 66X45 STRL (DRAPES) IMPLANT
DRAPE ORTHO SPLIT 77X108 STRL (DRAPES) ×2
DRAPE SURG ORHT 6 SPLT 77X108 (DRAPES) ×2 IMPLANT
DRSG MEPILEX BORDER 4X12 (GAUZE/BANDAGES/DRESSINGS) IMPLANT
DRSG MEPILEX BORDER 4X8 (GAUZE/BANDAGES/DRESSINGS) ×2 IMPLANT
DURAPREP 26ML APPLICATOR (WOUND CARE) ×2 IMPLANT
ELECT BLADE 6.5 EXT (BLADE) IMPLANT
ELECT CAUTERY BLADE 6.4 (BLADE) ×2 IMPLANT
ELECT REM PT RETURN 9FT ADLT (ELECTROSURGICAL) ×2
ELECTRODE REM PT RTRN 9FT ADLT (ELECTROSURGICAL) ×1 IMPLANT
EVACUATOR 1/8 PVC DRAIN (DRAIN) IMPLANT
FACESHIELD LNG OPTICON STERILE (SAFETY) ×4 IMPLANT
GLOVE BIO SURGEON STRL SZ 6.5 (GLOVE) ×2 IMPLANT
GLOVE BIO SURGEON STRL SZ8.5 (GLOVE) ×2 IMPLANT
GLOVE BIOGEL PI IND STRL 6.5 (GLOVE) ×1 IMPLANT
GLOVE BIOGEL PI IND STRL 8 (GLOVE) ×2 IMPLANT
GLOVE BIOGEL PI IND STRL 8.5 (GLOVE) ×1 IMPLANT
GLOVE BIOGEL PI INDICATOR 6.5 (GLOVE) ×1
GLOVE BIOGEL PI INDICATOR 8 (GLOVE) ×2
GLOVE BIOGEL PI INDICATOR 8.5 (GLOVE) ×1
GLOVE ECLIPSE 8.0 STRL XLNG CF (GLOVE) ×2 IMPLANT
GLOVE SURG ORTHO 8.5 STRL (GLOVE) ×4 IMPLANT
GOWN PREVENTION PLUS XXLARGE (GOWN DISPOSABLE) ×2 IMPLANT
GOWN STRL NON-REIN LRG LVL3 (GOWN DISPOSABLE) ×2 IMPLANT
GOWN STRL REIN XL XLG (GOWN DISPOSABLE) ×4 IMPLANT
HANDPIECE INTERPULSE COAX TIP (DISPOSABLE)
IMMOBILIZER KNEE 20 (SOFTGOODS) ×2
IMMOBILIZER KNEE 20 THIGH 36 (SOFTGOODS) ×1 IMPLANT
IMMOBILIZER KNEE 22 UNIV (SOFTGOODS) IMPLANT
IMMOBILIZER KNEE 24 THIGH 36 (MISCELLANEOUS) IMPLANT
IMMOBILIZER KNEE 24 UNIV (MISCELLANEOUS)
KIT BASIN OR (CUSTOM PROCEDURE TRAY) ×2 IMPLANT
KIT ROOM TURNOVER OR (KITS) ×2 IMPLANT
MANIFOLD NEPTUNE II (INSTRUMENTS) ×2 IMPLANT
NEEDLE 22X1 1/2 (OR ONLY) (NEEDLE) ×2 IMPLANT
NS IRRIG 1000ML POUR BTL (IV SOLUTION) ×2 IMPLANT
PACK TOTAL JOINT (CUSTOM PROCEDURE TRAY) ×2 IMPLANT
PAD ARMBOARD 7.5X6 YLW CONV (MISCELLANEOUS) ×4 IMPLANT
PRESSURIZER FEMORAL UNIV (MISCELLANEOUS) IMPLANT
SET HNDPC FAN SPRY TIP SCT (DISPOSABLE) IMPLANT
STAPLER VISISTAT 35W (STAPLE) ×2 IMPLANT
SUCTION FRAZIER TIP 10 FR DISP (SUCTIONS) ×2 IMPLANT
SUT BONE WAX W31G (SUTURE) IMPLANT
SUT ETHIBOND NAB CT1 #1 30IN (SUTURE) ×6 IMPLANT
SUT MNCRL AB 3-0 PS2 18 (SUTURE) ×2 IMPLANT
SUT VIC AB 0 CT1 27 (SUTURE) ×1
SUT VIC AB 0 CT1 27XBRD ANBCTR (SUTURE) ×1 IMPLANT
SUT VIC AB 1 CT1 27 (SUTURE) ×1
SUT VIC AB 1 CT1 27XBRD ANBCTR (SUTURE) ×1 IMPLANT
SUT VIC AB 2-0 CT1 27 (SUTURE) ×1
SUT VIC AB 2-0 CT1 TAPERPNT 27 (SUTURE) ×1 IMPLANT
SYR CONTROL 10ML LL (SYRINGE) ×2 IMPLANT
TOWEL OR 17X24 6PK STRL BLUE (TOWEL DISPOSABLE) ×2 IMPLANT
TOWEL OR 17X26 10 PK STRL BLUE (TOWEL DISPOSABLE) ×2 IMPLANT
TOWER CARTRIDGE SMART MIX (DISPOSABLE) IMPLANT
TRAY FOLEY CATH 16FRSI W/METER (SET/KITS/TRAYS/PACK) ×2 IMPLANT
WATER STERILE IRR 1000ML POUR (IV SOLUTION) ×2 IMPLANT

## 2013-10-25 NOTE — Anesthesia Procedure Notes (Signed)
Procedure Name: Intubation Date/Time: 10/25/2013 1:33 PM Performed by: Margaree Mackintosh Pre-anesthesia Checklist: Patient identified, Timeout performed, Emergency Drugs available, Suction available and Patient being monitored Patient Re-evaluated:Patient Re-evaluated prior to inductionOxygen Delivery Method: Circle system utilized Preoxygenation: Pre-oxygenation with 100% oxygen Intubation Type: IV induction Ventilation: Mask ventilation without difficulty Laryngoscope Size: Mac and 3 Grade View: Grade I Tube type: Oral Tube size: 7.0 mm Number of attempts: 1 Airway Equipment and Method: Stylet Placement Confirmation: ETT inserted through vocal cords under direct vision,  positive ETCO2 and breath sounds checked- equal and bilateral Tube secured with: Tape Dental Injury: Teeth and Oropharynx as per pre-operative assessment

## 2013-10-25 NOTE — Transfer of Care (Signed)
Immediate Anesthesia Transfer of Care Note  Patient: [name]  Procedure(s) Performed: Procedure(s): TOTAL HIP ARTHROPLASTY- left (Left)  Patient Location: PACU  Anesthesia Type:General  Level of Consciousness: awake, alert , oriented and patient cooperative  Airway & Oxygen Therapy: Patient Spontanous Breathing and Patient connected to nasal cannula oxygen  Post-op Assessment: Report given to PACU RN, Post -op Vital signs reviewed and stable and Patient moving all extremities X 4  Post vital signs: Reviewed and stable  Complications: No apparent anesthesia complications

## 2013-10-25 NOTE — Op Note (Signed)
The recent History & Physical has been reviewed. I have personally examined the patient today. There is no interval change to the documented History & Physical. The patient would like to proceed with the procedure.  [name] W 10/25/2013,  12:50 PM

## 2013-10-25 NOTE — Anesthesia Preprocedure Evaluation (Addendum)
Anesthesia Evaluation  Patient identified by MRN, date of birth, ID band Patient awake    Reviewed: Allergy & Precautions, H&P , NPO status , Patient's Chart, lab work & pertinent test results  History of Anesthesia Complications (+) history of anesthetic complications  Airway Mallampati: II TM Distance: >3 FB Neck ROM: full    Dental no notable dental hx. (+) Edentulous Upper and Dental Advisory Given   Pulmonary neg pulmonary ROS,  breath sounds clear to auscultation  Pulmonary exam normal       Cardiovascular hypertension, Pt. on medications Rhythm:regular Rate:Normal     Neuro/Psych Depression negative neurological ROS     GI/Hepatic Neg liver ROS, GERD-  Medicated and Controlled,  Endo/Other  diabetes, Well Controlled, Type 1, Insulin DependentHypothyroidism   Renal/GU negative Renal ROS  negative genitourinary   Musculoskeletal   Abdominal   Peds  Hematology negative hematology ROS (+)   Anesthesia Other Findings   Reproductive/Obstetrics negative OB ROS                          Anesthesia Physical Anesthesia Plan  ASA: III  Anesthesia Plan: General   Post-op Pain Management:    Induction: Intravenous  Airway Management Planned: Oral ETT  Additional Equipment:   Intra-op Plan:   Post-operative Plan: Extubation in OR  Informed Consent: I have reviewed the patients History and Physical, chart, labs and discussed the procedure including the risks, benefits and alternatives for the proposed anesthesia with the patient or authorized representative who has indicated his/her understanding and acceptance.   Dental Advisory Given  Plan Discussed with: CRNA and Surgeon  Anesthesia Plan Comments:         Anesthesia Quick Evaluation

## 2013-10-25 NOTE — Op Note (Signed)
PATIENT ID:      ASHANTEE DEUPREE  MRN:     [id number] DOB/AGE:    1951-07-07 / 62 y.o.       OPERATIVE REPORT    DATE OF PROCEDURE:  10/25/2013       PREOPERATIVE DIAGNOSIS:   Left Hip Osteoarthritis-end stage                                                       Estimated body mass index is 22.11 kg/(m^2) as calculated from the following:   Height as of 10/17/13: 5' (1.524 m).   Weight as of 08/24/13: 51.347 kg (113 lb 3.2 oz).     POSTOPERATIVE DIAGNOSIS:   End stage osteoarthritis left hip                                                                   Estimated body mass index is 22.11 kg/(m^2) as calculated from the following:   Height as of 10/17/13: 5' (1.524 m).   Weight as of 08/24/13: 51.347 kg (113 lb 3.2 oz).     PROCEDURE:  Procedure(s): TOTAL HIP ARTHROPLASTY- left left     SURGEON:  [name], MD    ASSISTANT:   [name], PA-C   (Present and scrubbed throughout the case, critical for assistance with exposure, retraction, instrumentation, and closure.)          ANESTHESIA: general     DRAINS: none :      TOURNIQUET TIME: * No tourniquets in log *    COMPLICATIONS:  None   CONDITION:  stable  PROCEDURE IN [id number]   Cleophas Dunker, Megha Agnes W  10/25/2013, 3:26 PM

## 2013-10-25 NOTE — Consult Note (Signed)
Medical Consultation   [name]  WUJ:[id number]  DOB: [birth year]  DOA: 10/25/2013  PCP: Avon Gully, MD  Requesting physician: Dr Cleophas Dunker  Reason for consultation: Medical management   History of Present Illness: Is a 62 year old female with history of hypertension, hyperlipidemia, diabetes, hypothyroidism, chronic hyponatremia status post left hip total arthroplasty today. Medical consult was requested by Dr. Cleophas Dunker for comanagement. Patient had severe osteoarthritis and degenerative changes of left hip, necessitating left hip on the past. Patient was seen in the PACU after the surgery, except for some pain, she did not have any complaints. She has history of type 2 diabetes mellitus on insulin, on Levemir insulin 70 units a.m. and 8 units p.m. and sliding scale and has uncontrolled blood sugars. She has chronic hyponatremia with Na in low 130's.  Currently has some tachycardia in low 100's, after surgery but no chest pain or shortness of breath.  Allergies:   Allergies  Allergen Reactions  . Meperidine Hcl Nausea And Vomiting  . Penicillins Hives      Past Medical History  Diagnosis Date  . Diabetes mellitus   . Hypertension   . Acute diverticulosis     First episode 2011. second episode August 2014.  . Drug allergy     To augmentin  . Osteoarthritis   . GERD (gastroesophageal reflux disease)   . Hyperlipidemia   . Depression   . Hypothyroidism   . Internal hemorrhoids with other complication 07/07/2013  . PONV (postoperative nausea and vomiting)   . Urinary frequency     Past Surgical History  Procedure Laterality Date  . Partial hysterectomy    . Bladder surgery      Tack  . Carpal tunnel release      Bilateral  . Arm surgery      Bilateral  . Lipoma excision      From abdomen  . Tonsillectomy    . Goiter removal    . Ileocolonoscopy  04/06/2008    NFA:OZHYQM terminal ileum, approximately 15-20 cm visualized/sigmoid colon  diverticula/Normal retroflexed view of the rectum.  . Esophagogastroduodenoscopy  04/06/2008    VHQ:IONGEX erosions with occasional ulceration in the distal esophagus at the GE junction.  Patent fibrous ring/3-4 cm sliding hiatal hernia/Normal duodenal bulb ampulla in second portion of the duodenum  . Cataract extraction      bilateral  . Colonoscopy N/A 06/22/2013    BMW:UXLKGMWN HEMORRHOIDS/Mild diverticulosis  in the ascending colon &s moderate diverticulosis  in the descending colon and sigmoid colon/Two SMALL RECTAL polyps & The colon IS redundant. hyperplastic polyp. next TCS 05/2023.  Marland Kitchen Hemorrhoid banding N/A 06/22/2013    SLF: BANDS APPLIED SUCCESSFULLY  . Ovarian cyst removal    . Dilation and curettage of uterus    . Joint replacement Right     rt. middlle finger oint    Social History:  reports that she has never smoked. She does not have any smokeless tobacco history on file. She reports that she does not drink alcohol or use illicit drugs.  Family History  Problem Relation Age of Onset  . Hypertension Other   . Coronary artery disease Other   . Diabetes Other   . Stroke Mother   . Diabetes Mother   . Hypertension Mother   . Heart disease Father   . Hyperlipidemia Father   . Diabetes Brother   . Hypertension Brother   . Colon cancer Neg Hx     Review of Systems:  Review of Systems:  Constitutional:  Denies fever, chills, diaphoresis, appetite change and fatigue.  HEENT: Denies photophobia, eye pain, redness, hearing loss, ear pain, congestion, sore throat, rhinorrhea, sneezing, mouth sores, trouble swallowing, neck pain, neck stiffness and tinnitus.   Respiratory: Denies SOB, DOE, cough, chest tightness,  and wheezing.   Cardiovascular: Denies chest pain, palpitations and leg swelling.  Gastrointestinal: Denies nausea, vomiting, abdominal pain, diarrhea, constipation, blood in stool and abdominal distention.  Genitourinary: Denies dysuria, urgency, frequency,  hematuria, flank pain and difficulty urinating.  Musculoskeletal: Chronic pain in the left hip and leg, pelvic area, ambulates with a walker Skin: Denies pallor, rash and wound.  Neurological: Denies dizziness, seizures, syncope, weakness, light-headedness, numbness and headaches.  Hematological: Denies adenopathy. Easy bruising, personal or family bleeding history  Psychiatric/Behavioral: Denies suicidal ideation, mood changes, confusion, nervousness, sleep disturbance and agitation   Physical Exam: Blood pressure 139/64, pulse 85, temperature 98.6 F (37 C), temperature source Oral, resp. rate 18, SpO2 92.00%.  General: Alert and awake, oriented x3, not in any acute distress. HEENT: normocephalic, atraumatic, anicteric sclera, PERLA, EOMI, oropharynx clear CVS: Tachycardia, sinus S1-S2 clear, no murmur rubs or gallops Chest: clear to auscultation b/l anteriorly  Abdomen: soft nontender, nondistended, hypoactive bowel sounds Extremities: no cyanosis, clubbing or edema noted bilaterally Neuro: Able to wiggle her toes Psych: alert and oriented, stable mood and affect Skin: no rashes or lesions  Labs on Admission:  Basic Metabolic Panel: No results found for this basename: NA, K, CL, CO2, GLUCOSE, BUN, CREATININE, CALCIUM, MG, PHOS,  in the last 168 hours Liver Function Tests: No results found for this basename: AST, ALT, ALKPHOS, BILITOT, PROT, ALBUMIN,  in the last 168 hours No results found for this basename: LIPASE, AMYLASE,  in the last 168 hours No results found for this basename: AMMONIA,  in the last 168 hours CBC: No results found for this basename: WBC, NEUTROABS, HGB, HCT, MCV, PLT,  in the last 168 hours Cardiac Enzymes: No results found for this basename: CKTOTAL, CKMB, CKMBINDEX, TROPONINI,  in the last 168 hours BNP: No components found with this basename: POCBNP,  CBG:  Recent Labs Lab 10/25/13 1104  GLUCAP 237*    Inpatient Medications:   Scheduled Meds: .  chlorhexidine  60 mL Topical Q2000  . chlorhexidine  60 mL Topical Once  . scopolamine  1 patch Transdermal Q72H   Continuous Infusions: . sodium chloride    . lactated ringers 50 mL/hr at 10/25/13 1149     Radiological Exams on Admission: No results found.  Impression/Recommendations Principal Problem:   S/P total hip arthroplasty - Per primary team - DVT prophylaxis, pain control per primary team (ortho)   Active Problems: Type 2 diabetes mellitus, insulin-dependent: patient is on extremely high doses of insulin - Will restart Levemir 70units, 80units PM, resistant sliding scale with meal coverage - Obtain hemoglobin A1c, diabetic coordinator consult    HYPOTHYROIDISM: - Continue Synthroid    HYPERLIPIDEMIA - Continue omega-3 fatty acids    HYPERTENSION,  - Restart verapamil, lisinopril - Recheck BMET for creatinine function, creatinine function stable 0.8 on 10/20    GERD (gastroesophageal reflux disease): Continue PPI    Chronic Hyponatremia - Will recheck BMET, patient is also on Celexa which can cause hyponatremia. Last sodium level was 131 on 10/20  DVT prophylaxis : Per primary team     Thank you for this consultation, [facility] hospitalist service will follow patient with you .  Time Spent on Admission: 45 mins  Maia Handa M.D. [facility] 10/25/2013,  3:59 PM

## 2013-10-25 NOTE — Progress Notes (Signed)
Orthopedic Tech Progress Note Patient Details:  [name] 03/27/1951 [id number] Patient has knee immobilizer Patient ID: [name], female   DOB: 02-01-1951, 62 y.o.   MRN: [id number]   [name] 10/25/2013, 6:58 PM

## 2013-10-25 NOTE — Anesthesia Postprocedure Evaluation (Signed)
  Anesthesia Post-op Note  Patient: [name]  Procedure(s) Performed: Procedure(s): TOTAL HIP ARTHROPLASTY- left (Left)  Patient Location: PACU  Anesthesia Type:General  Level of Consciousness: awake and alert   Airway and Oxygen Therapy: Patient Spontanous Breathing and Patient connected to nasal cannula oxygen  Post-op Pain: mild  Post-op Assessment: Post-op Vital signs reviewed, Patient's Cardiovascular Status Stable, Respiratory Function Stable, Patent Airway and Pain level controlled  Post-op Vital Signs: stable  Complications: No apparent anesthesia complications

## 2013-10-25 NOTE — Preoperative (Signed)
Beta Blockers   Reason not to administer Beta Blockers:Not Applicable 

## 2013-10-26 ENCOUNTER — Encounter (HOSPITAL_COMMUNITY): Payer: Self-pay | Admitting: General Practice

## 2013-10-26 LAB — GLUCOSE, CAPILLARY
Glucose-Capillary: 197 mg/dL — ABNORMAL HIGH (ref 70–99)
Glucose-Capillary: 221 mg/dL — ABNORMAL HIGH (ref 70–99)
Glucose-Capillary: 191 mg/dL — ABNORMAL HIGH (ref 70–99)

## 2013-10-26 LAB — BASIC METABOLIC PANEL
Calcium: 8.3 mg/dL — ABNORMAL LOW (ref 8.4–10.5)
GFR calc Af Amer: 58 mL/min — ABNORMAL LOW (ref >90)
GFR calc non Af Amer: 50 mL/min — ABNORMAL LOW (ref >90)
Potassium: 5.1 mEq/L (ref 3.5–5.1)
Sodium: 131 mEq/L — ABNORMAL LOW (ref 135–145)

## 2013-10-26 LAB — HEMOGLOBIN A1C
Hgb A1c MFr Bld: 9 % — ABNORMAL HIGH (ref <5.7)
Mean Plasma Glucose: 212 mg/dL — ABNORMAL HIGH (ref <117)

## 2013-10-26 LAB — CBC
MCHC: 37 g/dL — ABNORMAL HIGH (ref 30.0–36.0)
RDW: 12.9 % (ref 11.5–15.5)

## 2013-10-26 MED ORDER — CANAGLIFLOZIN 100 MG PO TABS
100.0000 mg | ORAL_TABLET | Freq: Every day | ORAL | Status: DC
  Administered 2013-10-27 – 2013-10-28 (×2): 100 mg via ORAL
  Filled 2013-10-26 (×2): qty 1

## 2013-10-26 MED ORDER — INSULIN DETEMIR 100 UNIT/ML ~~LOC~~ SOLN
85.0000 [IU] | Freq: Every day | SUBCUTANEOUS | Status: DC
  Administered 2013-10-26 – 2013-10-27 (×2): 85 [IU] via SUBCUTANEOUS
  Filled 2013-10-26 (×3): qty 0.85

## 2013-10-26 MED ORDER — SODIUM CHLORIDE 0.9 % IV BOLUS (SEPSIS)
500.0000 mL | Freq: Once | INTRAVENOUS | Status: AC
  Administered 2013-10-26: 500 mL via INTRAVENOUS

## 2013-10-26 MED ORDER — INSULIN DETEMIR 100 UNIT/ML ~~LOC~~ SOLN
75.0000 [IU] | Freq: Every morning | SUBCUTANEOUS | Status: DC
  Administered 2013-10-27 – 2013-10-28 (×2): 75 [IU] via SUBCUTANEOUS
  Filled 2013-10-26 (×2): qty 0.75

## 2013-10-26 MED ORDER — INSULIN ASPART 100 UNIT/ML ~~LOC~~ SOLN
10.0000 [IU] | Freq: Three times a day (TID) | SUBCUTANEOUS | Status: DC
  Administered 2013-10-27 – 2013-10-28 (×4): 10 [IU] via SUBCUTANEOUS

## 2013-10-26 NOTE — Progress Notes (Signed)
UR COMPLETED  

## 2013-10-26 NOTE — Op Note (Signed)
NAMEMAILI, SHUTTERS            ACCOUNT NO.:  [id number]  MEDICAL RECORD NO.:  [id number]  LOCATION:  5N09C                        FACILITY:  [facility]  PHYSICIAN:  Claude Manges. Alanya Vukelich, M.D.DATE OF BIRTH:  02/23/1951  DATE OF PROCEDURE:  10/25/2013 DATE OF DISCHARGE:                              OPERATIVE REPORT   PREOPERATIVE DIAGNOSIS:  End-stage osteoarthritis, left hip.  POSTOPERATIVE DIAGNOSIS:  End-stage osteoarthritis, left hip.  PROCEDURE:  Left total hip replacement.  SURGEON:  Claude Manges. Cleophas Dunker, M.D.  ASSISTANT:  [name], [facility], was present throughout the operative procedure.  ANESTHESIA:  General.  COMPLICATIONS:  None.  COMPONENTS:  DePuy AML large stature 10.5 mm femoral stem, 48 mm outer diameter Gription metallic acetabular component with a marathon polyethylene +4 liner with a 10-degree posterior lip, an apex hole eliminator, 32 mm outer diameter metallic hip ball with an 1 mm neck length.  COMPONENTS:  Press-fit.  DESCRIPTION OF PROCEDURE:  Ms. Masser was met in the holding area, identified the left hip was the appropriate operative site and answered any questions.  She was then transported to room #7 and placed under general anesthesia without difficulty.  The nursing staff inserted a Foley catheter.  Urine was clear.  The patient was then placed in the lateral decubitus position with the left side up and secured to the operating room table with the Innomed hip system.  The left hip was then prepped from the iliac crest to the midfoot with chlorhexidine scrub and then DuraPrep.  Sterile draping was performed.  The time-out was called.  Routine Southern incision was utilized and via sharp dissection, carried down to subcutaneous tissue.  Small bleeders were Bovie coagulated. First, the iliotibial band was identified and incised.  The patient was very small weighing approximately just under 100 pounds and the tissues were within.  The muscle was  then separated through 1 of its raphe along this length of the skin incision, and then retracted with a self- retaining retractor.  Soft tissue was then manually separated from the short external rotators.  Tendinous structures were tagged with 0 Ethibond suture and then the short external rotators were incised and put attachment to the posterior aspect of the greater trochanter.  The capsule was identified and then carefully incised along the femoral neck and head.  There was a serosanguineous effusion.  The head was easily dislocated posteriorly.  The majority of the head was devoid of articular cartilage and flaps and using the calcar guide at a point about a fingerbreadth proximal to the lesser trochanter, osteotomy was made, and the head was then removed from the wound.  We initially prepared the femur.  Retractors were placed about the femur.  Starter hole was then made with the reamer and then I carefully reamed to 10 mm, and I was tight at both edges but it was very careful about reaming quickly and then rasped using the 10.5 mm rasp.  Now there was flush on the calcar I felt like there is just a little bit loose, so I elected to proceed with the large stature femoral component, and this was carefully and slowly impacted in place.  This was a good fit and flat  on the calcar, I did use a calcar reamer to obtain the appropriate calcar angle after retraining I inserted the canal finer and felt that I was within bone.  Retractors were then placed around the acetabulum.  I sharply excised the labrum.  I then reamed to 47 mm outer diameter to accept a 48 mm cup.  I had a nice step, had nice bleeding bone circumferentially and excellent bone stock.  I trialed a 46 and 48 acetabulum component 46. Had good rim fit, but would completely seat the 48.  Had good rim fit, but not completely seat, so I elected to use the Gription multi-holed acetabular component.  This was carefully impacted  and appeared to be flushed and was nice and tight and did not require any screws.  The trial polyethylene liner was then inserted.  I reinserted the large stature femoral stem and used a 32-mm outer diameter hip ball with the +1 mm neck length.  The entire construct was reduced and through a full range of motion with perfect stability and no toggling and leg lengths appeared to be symmetrical.  The trial components removed.  The joint was copiously irrigated with saline solution.  The apex hole eliminator was inserted followed by the final marathon polyethylene liner fit.  Retractors were then placed around the femur.  The final 10.5-mm large stature femoral component was then carefully and slowly impacted flush on the calcar.  Wound was again irrigated.  The Morse taper neck was dried and the final 32 mm outer diameter hip ball with a +1 mm neck length was then impacted.  The acetabulum appeared to be clean and was irrigated.  The hip was then reduced.  We had excellent stability.  Full range of motion without being too tight in extension and no evidence of subluxation.  Leg lengths appeared to be symmetrical.  I did check the sciatic nerve throughout the procedure and felt that it was carefully protected.  In addition, the capsule was closed anatomically with interrupted #1 Ethibond.  The short external rotators closed with similar material. The wound was again irrigated.  The IT band was closed with running #1 Vicryl.  Subcu was closed with 3-0 Monocryl.  Skin closed with skin clips.  Sterile bulky dressing was applied.  The patient was then woken and placed in the supine position, without problems and then transported to recovery room.  The patient tolerated the procedure without complications.     Claude Manges. Cleophas Dunker, M.D.     PWW/MEDQ  D:  10/25/2013  T:  10/26/2013  Job:  [id number]

## 2013-10-26 NOTE — Progress Notes (Signed)
Chart reviewed.  Subjective: Concerned about not receiving her Invokana.  Blood glucoses run 200 - 300 at home, but has noticed improvement since it was started. Followed by Dr. Fransico Him for DM.  Pain controlled. No nausea.  Objective: Vital signs in last 24 hours: Temp:  [98 F (36.7 C)-99.3 F (37.4 C)] 99.3 F (37.4 C) (10/29 1246) Pulse Rate:  [74-104] 82 (10/29 1246) Resp:  [16-18] 17 (10/29 1246) BP: (91-131)/(46-70) 100/48 mmHg (10/29 1246) SpO2:  [95 %-99 %] 95 % (10/29 1246) Weight change:  Last BM Date: 10/24/13  Intake/Output from previous day: 10/28 0701 - 10/29 0700 In: 2700 [I.V.:2700] Out: 2530 [Urine:730; Stool:1300; Blood:500] Intake/Output this shift: Total I/O In: 390 [P.O.:390] Out: -   Gen: alert, oriented. Talkative. Eating supper. Lungs CTA without MGR CV RRR without WRR Abd S, NT, ND Ext no CCE  Lab Results:  Recent Labs  10/25/13 1922 10/26/13 0432  WBC 19.8* 15.6*  HGB 10.8* 10.0*  HCT 29.7* 27.0*  PLT 212 201   BMET  Recent Labs  10/25/13 1922 10/26/13 0432  NA 133* 131*  K 4.7 5.1  CL 100 100  CO2 20 23  GLUCOSE 321* 243*  BUN 30* 31*  CREATININE 1.02 1.13*  CALCIUM 8.5 8.3*    Studies/Results: Dg Pelvis Portable  10/25/2013   CLINICAL DATA:  Postop total left hip replacement.  EXAM: PORTABLE PELVIS  COMPARISON:  None.  FINDINGS: Changes of left total hip replacement. No hardware or bony complicating feature. Normal AP alignment.  IMPRESSION: Left hip replacement. No complicating features.   Electronically Signed   By: Charlett Nose M.D.   On: 10/25/2013 16:26   Dg Hip Portable 1 View Left  10/25/2013   CLINICAL DATA:  Postop total left hip replacement.  EXAM: PORTABLE LEFT HIP - 1 VIEW  COMPARISON:  Pelvis imaged performed today.  FINDINGS: Cross-table lateral view demonstrates changes of left hip replacement. Normal alignment. No visible bony complicating feature.  IMPRESSION: Left hip replacement. No visible complicating  feature.   Electronically Signed   By: Charlett Nose M.D.   On: 10/25/2013 16:28    Medications:  Scheduled Meds: . citalopram  20 mg Oral Daily  . docusate sodium  100 mg Oral BID  . insulin aspart  0-20 Units Subcutaneous TID WC  . insulin aspart  0-5 Units Subcutaneous QHS  . insulin aspart  5 Units Subcutaneous TID WC  . insulin detemir  70 Units Subcutaneous q morning - 10a  . insulin detemir  80 Units Subcutaneous QHS  . levothyroxine  75 mcg Oral QHS  . lisinopril  40 mg Oral Daily  . pantoprazole  40 mg Oral Daily  . rivaroxaban  10 mg Oral Q24H  . verapamil  120 mg Oral BID  . vitamin C  500 mg Oral BID   Continuous Infusions:  PRN Meds:.acetaminophen, acetaminophen, alum & mag hydroxide-simeth, bisacodyl, HYDROmorphone (DILAUDID) injection, magnesium hydroxide, menthol-cetylpyridinium, methocarbamol (ROBAXIN) IV, methocarbamol, metoCLOPramide (REGLAN) injection, metoCLOPramide, ondansetron (ZOFRAN) IV, ondansetron, oxyCODONE, phenol, zolpidem  Imp/Rec Principal Problem:   S/P total hip arthroplasty Active Problems:   HYPOTHYROIDISM   HYPERLIPIDEMIA   HYPERTENSION,    GERD (gastroesophageal reflux disease)   Type II or unspecified type diabetes mellitus without mention of complication, uncontrolled   Hyponatremia, chronic and stable  CBGs high. Will resume Invokana, increase levemir, novolog and monitor.   LOS: 1 day   Legacie Dillingham L 10/26/2013, 4:49 PM

## 2013-10-26 NOTE — Progress Notes (Signed)
Patient ID: [name], female   DOB: [birth year], 62 y.o.   MRN: [id number] PATIENT ID: [name]        MRN:  [id number]          DOB/AGE: June 24, 1951 / 62 y.o.    Cheryl Campbell, MD   [name], PA-C [address] [place], [place]  [address]                             [phone]   PROGRESS NOTE  Subjective:  negative for Chest Pain  negative for Shortness of Breath  negative for Nausea/Vomiting   negative for Calf Pain    Tolerating Diet: yes         Patient reports pain as mild.     Sitting up in chair eating breakfast, comfortable  Objective: Vital signs in last 24 hours:   Patient Vitals for the past 24 hrs:  BP Temp Temp src Pulse Resp SpO2  10/26/13 0532 100/50 mmHg - - - - -  10/26/13 0528 91/46 mmHg 98.5 F (36.9 C) - 75 16 99 %  10/26/13 0400 - - - - 18 97 %  10/26/13 0145 108/54 mmHg - - - - -  10/26/13 0140 96/50 mmHg 98 F (36.7 C) - 74 18 98 %  10/26/13 0000 - - - - 18 99 %  10/25/13 2253 123/54 mmHg - - - - -  10/25/13 2039 126/62 mmHg 98.1 F (36.7 C) - 96 18 98 %  10/25/13 2000 - - - - 17 -  10/25/13 1901 120/70 mmHg - - - - -  10/25/13 1745 131/61 mmHg 98.6 F (37 C) - 104 16 98 %  10/25/13 1730 - 98.6 F (37 C) - 103 17 98 %  10/25/13 1719 100/70 mmHg - - 103 17 98 %  10/25/13 1715 - - - 102 17 97 %  10/25/13 1704 119/52 mmHg - - 100 17 98 %  10/25/13 1649 133/54 mmHg - - 100 21 97 %  10/25/13 1645 - - - 99 18 99 %  10/25/13 1634 149/63 mmHg - - 100 19 98 %  10/25/13 1630 - - - 99 15 100 %  10/25/13 1619 155/66 mmHg - - 102 24 97 %  10/25/13 1615 - - - 102 24 96 %  10/25/13 1604 153/66 mmHg - - 102 24 94 %  10/25/13 1549 156/69 mmHg 98.6 F (37 C) - 109 19 92 %  10/25/13 1057 139/64 mmHg 97.4 F (36.3 C) Oral 85 18 100 %      Intake/Output from previous day:   10/28 0701 - 10/29 0700 In: 2700 [I.V.:2700] Out: 2530 [Urine:730]   Intake/Output this shift:       Intake/Output     10/28 0701 - 10/29 0700 10/29  0701 - 10/30 0700   I.V. 2700    Total Intake 2700     Urine 730    Stool 1300    Blood 500    Total Output 2530     Net +170          Emesis Occurrence 1 x       LABORATORY DATA:  Recent Labs  10/25/13 1922 10/26/13 0432  WBC 19.8* 15.6*  HGB 10.8* 10.0*  HCT 29.7* 27.0*  PLT 212 201    Recent Labs  10/25/13 1922 10/26/13 0432  NA 133* 131*  K 4.7 5.1  CL 100  100  CO2 20 23  BUN 30* 31*  CREATININE 1.02 1.13*  GLUCOSE 321* 243*  CALCIUM 8.5 8.3*   Lab Results  Component Value Date   INR 1.01 10/17/2013   INR 1.0 02/20/2009    Recent Radiographic Studies :  Chest 2 View  10/17/2013   CLINICAL DATA:  Pre-admission for left hip replacement  EXAM: CHEST  2 VIEW  COMPARISON:  12/17/2009  FINDINGS: Cardiomediastinal silhouette is stable. No acute infiltrate or pleural effusion. No pulmonary edema. Bony thorax is unremarkable. Atherosclerotic calcifications of abdominal and thoracic aorta.  IMPRESSION: No active cardiopulmonary disease.   Electronically Signed   By: Natasha Mead M.D.   On: 10/17/2013 15:57   US Transvaginal Non-ob  09/27/2013   CLINICAL DATA:  History of previous hysterectomy. History of cystic areas in left adnexal region.  EXAM: TRANSABDOMINAL AND TRANSVAGINAL ULTRASOUND OF PELVIS  TECHNIQUE: Both transabdominal and transvaginal ultrasound examinations of the pelvis were performed. Transabdominal technique was performed for global imaging of the pelvis including uterus, ovaries, adnexal regions, and pelvic cul-de-sac. It was necessary to proceed with endovaginal exam following the transabdominal exam to visualize the details of the ovaries.  COMPARISON:  MRI 09/16/2013. CT 05/20/2010. CT 08/24/2013.  FINDINGS: Uterus  Measurements: There is history of previous hysterectomy. No uterus is evident.  Endometrium  There is history of previous hysterectomy.  Right ovary  Measurements: The right ovary measured 2.2 x 1.5 x 1.3 cm. Color Doppler imaging showed ovarian  blood flow. No ovarian or adnexal mass is seen on the right.  Left ovary  Measurements: The left ovary measured 2.8 x 2.4 x 1.5 cm. Color Doppler examination showed ovarian parenchymal blood flow. The majority of the left ovary is associated with cystic structures. These could reflect multiple cysts or cystic structure with multiple septations. A cystic area with associated calcification has been present since CT examination in 2011. The overall structure appears larger on the current study.  Other findings  No free fluid.  IMPRESSION: Post hysterectomy. No free fluid in the pelvis. No abnormality of the right ovary or right adnexal region.  The left ovary overall measured 2.8 x 2.4 x 1.5 cm. Color Doppler examination showed ovarian parenchymal blood flow. The majority of the left ovary is associated with cystic structures. Previous studies are demonstrated cystic change in the left adnexal region with adjacent calcification. No definite fat density was demonstrated on previous MR or CT in the left ovarian mass. Dermoid is a possibility. However the area appears slightly larger than on the 2011 study. Malignancy cannot be excluded. Malignant transformation of dermoid is a possibility. Serous or mucinous cystadenoma or cystadenocarcinoma are possibilities. Recommend surgical consultation.   Electronically Signed   By: Onalee Hua  Call M.D.   On: 09/27/2013 08:50   US Pelvis Complete  09/27/2013   CLINICAL DATA:  History of previous hysterectomy. History of cystic areas in left adnexal region.  EXAM: TRANSABDOMINAL AND TRANSVAGINAL ULTRASOUND OF PELVIS  TECHNIQUE: Both transabdominal and transvaginal ultrasound examinations of the pelvis were performed. Transabdominal technique was performed for global imaging of the pelvis including uterus, ovaries, adnexal regions, and pelvic cul-de-sac. It was necessary to proceed with endovaginal exam following the transabdominal exam to visualize the details of the ovaries.   COMPARISON:  MRI 09/16/2013. CT 05/20/2010. CT 08/24/2013.  FINDINGS: Uterus  Measurements: There is history of previous hysterectomy. No uterus is evident.  Endometrium  There is history of previous hysterectomy.  Right ovary  Measurements: The right  ovary measured 2.2 x 1.5 x 1.3 cm. Color Doppler imaging showed ovarian blood flow. No ovarian or adnexal mass is seen on the right.  Left ovary  Measurements: The left ovary measured 2.8 x 2.4 x 1.5 cm. Color Doppler examination showed ovarian parenchymal blood flow. The majority of the left ovary is associated with cystic structures. These could reflect multiple cysts or cystic structure with multiple septations. A cystic area with associated calcification has been present since CT examination in 2011. The overall structure appears larger on the current study.  Other findings  No free fluid.  IMPRESSION: Post hysterectomy. No free fluid in the pelvis. No abnormality of the right ovary or right adnexal region.  The left ovary overall measured 2.8 x 2.4 x 1.5 cm. Color Doppler examination showed ovarian parenchymal blood flow. The majority of the left ovary is associated with cystic structures. Previous studies are demonstrated cystic change in the left adnexal region with adjacent calcification. No definite fat density was demonstrated on previous MR or CT in the left ovarian mass. Dermoid is a possibility. However the area appears slightly larger than on the 2011 study. Malignancy cannot be excluded. Malignant transformation of dermoid is a possibility. Serous or mucinous cystadenoma or cystadenocarcinoma are possibilities. Recommend surgical consultation.   Electronically Signed   By: Onalee Hua  Call M.D.   On: 09/27/2013 08:50   Dg Pelvis Portable  10/25/2013   CLINICAL DATA:  Postop total left hip replacement.  EXAM: PORTABLE PELVIS  COMPARISON:  None.  FINDINGS: Changes of left total hip replacement. No hardware or bony complicating feature. Normal AP alignment.   IMPRESSION: Left hip replacement. No complicating features.   Electronically Signed   By: Charlett Nose M.D.   On: 10/25/2013 16:26   Dg Hip Portable 1 View Left  10/25/2013   CLINICAL DATA:  Postop total left hip replacement.  EXAM: PORTABLE LEFT HIP - 1 VIEW  COMPARISON:  Pelvis imaged performed today.  FINDINGS: Cross-table lateral view demonstrates changes of left hip replacement. Normal alignment. No visible bony complicating feature.  IMPRESSION: Left hip replacement. No visible complicating feature.   Electronically Signed   By: Charlett Nose M.D.   On: 10/25/2013 16:28     Examination:  General appearance: alert, cooperative and no distress  Wound Exam: clean, dry, intact   Drainage:  Scant/small amount Serosanguinous exudate  Motor Exam: EHL, FHL, Anterior Tibial and Posterior Tibial Intact  Sensory Exam: Superficial Peroneal, Deep Peroneal and Tibial normal-no history of neuropathy  Vascular Exam: Normal  Assessment:    1 Day Post-Op  Procedure(s) (LRB): TOTAL HIP ARTHROPLASTY- left (Left)  ADDITIONAL DIAGNOSIS:  Principal Problem:   S/P total hip arthroplasty Active Problems:   HYPOTHYROIDISM   HYPERLIPIDEMIA   HYPERTENSION,    GERD (gastroesophageal reflux disease)   Type II  type diabetes mellitus    Hyponatremia     Plan: Physical Therapy as ordered Weight Bearing as Tolerated (WBAT)  DVT Prophylaxis:  Xarelto  DISCHARGE PLAN: Skilled Nursing Facility/Rehab  DISCHARGE NEEDS: HHPT, Walker and 3-in-1 comode seat   post op films with good position of components, pt comfortable, foley out, PT today, has made arrangements for rehab at [facility] 10/26/2013 7:27 AM

## 2013-10-26 NOTE — Progress Notes (Signed)
10/26/13 PT eval recommended SNF, patient agreeable. Referral made to CSW.  Jacquelynn Cree RN, BSN, CCM

## 2013-10-26 NOTE — Evaluation (Signed)
Physical Therapy Evaluation Patient Details Name: [name] MRN: [id number] DOB: 01-13-1951 Today's Date: 10/26/2013 Time: 2440-1027 PT Time Calculation (min): 36 min  PT Assessment / Plan / Recommendation History of Present Illness  s/p LTHA; Post Prec  Clinical Impression  Patient is s/p above surgery resulting in functional limitations due to the deficits listed below (see PT Problem List).  Patient will benefit from skilled PT to increase their independence and safety with mobility to allow discharge to the venue listed below.       PT Assessment  Patient needs continued PT services    Follow Up Recommendations  SNF;Supervision/Assistance - 24 hour    Does the patient have the potential to tolerate intense rehabilitation      Barriers to Discharge Decreased caregiver support      Equipment Recommendations  Rolling walker with 5" wheels;3in1 (PT)    Recommendations for Other Services     Frequency 7X/week    Precautions / Restrictions Precautions Precautions: Posterior Hip Precaution Booklet Issued: Yes (comment) Precaution Comments: Educated in Haematologist Required Braces or Orthoses: Knee Immobilizer - Left Restrictions LLE Weight Bearing: Weight bearing as tolerated   Pertinent Vitals/Pain 6/10 pain LLE; patient repositioned for comfort       Mobility  Bed Mobility Bed Mobility: Not assessed Details for Bed Mobility Assistance: In chair upon arrival Transfers Transfers: Sit to Stand;Stand to Sit Sit to Stand: 4: Min assist;With armrests;From chair/3-in-1 Stand to Sit: 4: Min assist;With armrests;To chair/3-in-1 Details for Transfer Assistance: Cues for post prec, safety, hand placement Ambulation/Gait Ambulation/Gait Assistance: 4: Min guard;4: Min assist Ambulation Distance (Feet): 30 Feet Assistive device: Rolling walker Ambulation/Gait Assistance Details: Cues for gait sequence and for post prec with turns Gait Pattern: Step-to pattern     Exercises     PT Diagnosis: Difficulty walking;Acute pain  PT Problem List: Decreased strength;Decreased range of motion;Decreased activity tolerance;Decreased mobility;Decreased knowledge of use of DME;Decreased knowledge of precautions;Pain PT Treatment Interventions: DME instruction;Gait training;Functional mobility training;Therapeutic activities;Therapeutic exercise;Balance training;Patient/family education     PT Goals(Current goals can be found in the care plan section) Acute Rehab PT Goals Patient Stated Goal: To put on her 4 inch heels PT Goal Formulation: With patient Time For Goal Achievement: 11/02/13 Potential to Achieve Goals: Good  Visit Information  Last PT Received On: 10/26/13 Assistance Needed: +1 History of Present Illness: s/p LTHA; Post Prec       Prior Functioning  Home Living Family/patient expects to be discharged to:: Skilled nursing facility Living Arrangements: Alone Prior Function Level of Independence: Independent Communication Communication: No difficulties    Cognition  Cognition Arousal/Alertness: Awake/alert Behavior During Therapy: WFL for tasks assessed/performed Overall Cognitive Status: Within Functional Limits for tasks assessed    Extremity/Trunk Assessment Upper Extremity Assessment Upper Extremity Assessment: Overall WFL for tasks assessed Lower Extremity Assessment Lower Extremity Assessment: LLE deficits/detail LLE Deficits / Details: Grossly decr AROM and strength limited by pain potop   Balance    End of Session PT - End of Session Equipment Utilized During Treatment: Gait belt Activity Tolerance: Patient tolerated treatment well Patient left: in chair;with call bell/phone within reach Nurse Communication: Mobility status  GP     Olen Pel [place], [place] [phone]  10/26/2013, 1:51 PM

## 2013-10-27 ENCOUNTER — Inpatient Hospital Stay (HOSPITAL_COMMUNITY): Payer: Medicare Other

## 2013-10-27 DIAGNOSIS — R509 Fever, unspecified: Secondary | ICD-10-CM

## 2013-10-27 DIAGNOSIS — Z96649 Presence of unspecified artificial hip joint: Secondary | ICD-10-CM

## 2013-10-27 DIAGNOSIS — E039 Hypothyroidism, unspecified: Secondary | ICD-10-CM

## 2013-10-27 LAB — URINALYSIS, ROUTINE W REFLEX MICROSCOPIC
Bilirubin Urine: NEGATIVE
Glucose, UA: 500 mg/dL — AB
Ketones, ur: NEGATIVE mg/dL
Leukocytes, UA: NEGATIVE
Nitrite: NEGATIVE
Protein, ur: NEGATIVE mg/dL

## 2013-10-27 LAB — BASIC METABOLIC PANEL
Calcium: 8.3 mg/dL — ABNORMAL LOW (ref 8.4–10.5)
GFR calc Af Amer: 62 mL/min — ABNORMAL LOW (ref >90)
GFR calc non Af Amer: 53 mL/min — ABNORMAL LOW (ref >90)
Sodium: 134 mEq/L — ABNORMAL LOW (ref 135–145)

## 2013-10-27 LAB — GLUCOSE, CAPILLARY
Glucose-Capillary: 143 mg/dL — ABNORMAL HIGH (ref 70–99)
Glucose-Capillary: 122 mg/dL — ABNORMAL HIGH (ref 70–99)
Glucose-Capillary: 139 mg/dL — ABNORMAL HIGH (ref 70–99)

## 2013-10-27 LAB — CBC WITH DIFFERENTIAL/PLATELET
Basophils Relative: 0 % (ref 0–1)
Eosinophils Absolute: 0.1 10*3/uL (ref 0.0–0.7)
Hemoglobin: 9.1 g/dL — ABNORMAL LOW (ref 12.0–15.0)
Lymphocytes Relative: 15 % (ref 12–46)
Lymphs Abs: 2.1 10*3/uL (ref 0.7–4.0)
MCH: 30.8 pg (ref 26.0–34.0)
MCHC: 35.8 g/dL (ref 30.0–36.0)
MCV: 86.1 fL (ref 78.0–100.0)
Monocytes Relative: 9 % (ref 3–12)
Neutro Abs: 10.5 10*3/uL — ABNORMAL HIGH (ref 1.7–7.7)
Neutrophils Relative %: 74 % (ref 43–77)
Platelets: 163 10*3/uL (ref 150–400)
RBC: 2.95 MIL/uL — ABNORMAL LOW (ref 3.87–5.11)
RDW: 12.7 % (ref 11.5–15.5)
WBC: 14.1 10*3/uL — ABNORMAL HIGH (ref 4.0–10.5)

## 2013-10-27 MED ORDER — OXYCODONE HCL 5 MG PO TABS: 5.0000 mg | ORAL_TABLET | ORAL | Status: DC | PRN

## 2013-10-27 MED ORDER — RIVAROXABAN 10 MG PO TABS: 10.0000 mg | ORAL_TABLET | ORAL | Status: AC

## 2013-10-27 MED ORDER — METHOCARBAMOL 500 MG PO TABS: 500.0000 mg | ORAL_TABLET | Freq: Three times a day (TID) | ORAL | Status: AC | PRN

## 2013-10-27 NOTE — Discharge Summary (Signed)
Cheryl Campbell, MD   Cheryl Code, PA-C [address] [place], [place]  [address]                             [phone]  PATIENT ID: [name]        MRN:  [id number]          DOB/AGE: Feb 11, 1951 / 62 y.o.    DISCHARGE SUMMARY  ADMISSION DATE:    10/25/2013 DISCHARGE DATE:   10/28/2013   ADMISSION DIAGNOSIS: Left Hip Osteoarthritis    DISCHARGE DIAGNOSIS:  Left Hip Osteoarthritis    ADDITIONAL DIAGNOSIS: Principal Problem:   S/P total hip arthroplasty Active Problems:   HYPOTHYROIDISM   HYPERLIPIDEMIA   HYPERTENSION,    GERD (gastroesophageal reflux disease)   Type II or unspecified type diabetes mellitus without mention of complication, uncontrolled   Hyponatremia   Fever, unspecified  Past Medical History  Diagnosis Date  . Diabetes mellitus   . Hypertension   . Acute diverticulosis     First episode 2011. second episode August 2014.  . Drug allergy     To augmentin  . Osteoarthritis   . GERD (gastroesophageal reflux disease)   . Hyperlipidemia   . Depression   . Hypothyroidism   . Internal hemorrhoids with other complication 07/07/2013  . PONV (postoperative nausea and vomiting)   . Urinary frequency     PROCEDURE: Procedure(s): TOTAL HIP ARTHROPLASTY- left  on 10/25/2013  CONSULTS:  Treatment Team:  Mahala Menghini, MD   HISTORY: [name] is a very pleasant 62 year old white female who is seen today for evaluation of her left hip pain. In April of this year, she started having pain in the groin and up into the pelvic area, which she would try to get up and move her leg. It also started having some cramping in the anterior thigh down to the knee. There were times when her hip would lock up on her. She also has some pain in the lower lumbar spine but this is not the major symptom. She had worsened to the point where she was using a cane as well as heat and hydrocodone and aspirin. She had difficulty with sleeping, standing, sitting, walking. It  has become progressive. She was seen, noted to have some vascular disease mainly in the aorta and iliac arteries. Because of her discomfort, an MRI scan was ordered of her pelvis and also some vascular studies were obtained. The MRI revealed marked inflammatory changes with the left hip with effusion and synovitis. She has had severe osteoarthritis and degenerative change with no evidence of avascular necrosis. There was a 25 mm septated cystic lesion in the left pelvis. There was calcification similar to a remote CT scan, 05/08/2013. A pelvic ultrasound was suggested and ordered appropriately. Her vascular studies did show borderline elevated ABI within the left lower extremity most commonly associated with small vessel disease related to her diabetes. The ultrasound of the abdominal aorta was without evidence of aneurysm. Calcification noted in the abdominal aorta and the proximal common iliac arteries was noted. A pelvic ultrasound and transvaginal ultrasound of the pelvis were obtained. It appeared to have normal blood flow and no masses in both ovaries. There was a lot of cystic structure within the ovary. No definite fat density but there was a possibility that she could have a dermoid cyst, and if there was a case, it is probably that of a malignant transformation. This was  reviewed by Dr. Cleophas Dunker and it was felt that the hip was more of her symptoms.   HOSPITAL COURSE:  [name] is a 62 y.o. admitted on 10/25/2013 and found to have a diagnosis of Left Hip Osteoarthritis.  After appropriate laboratory studies were obtained  they were taken to the operating room on 10/25/2013 and underwent  Procedure(s): TOTAL HIP ARTHROPLASTY- left  .   They were given perioperative antibiotics:  Anti-infectives   Start     Dose/Rate Route Frequency Ordered Stop   10/26/13 0100  vancomycin (VANCOCIN) IVPB 1000 mg/200 mL premix     1,000 mg 200 mL/hr over 60 Minutes Intravenous Every 12 hours 10/25/13  1758 10/26/13 0058   10/25/13 0600  vancomycin (VANCOCIN) IVPB 1000 mg/200 mL premix     1,000 mg 200 mL/hr over 60 Minutes Intravenous On call to O.R. 10/24/13 1420 10/25/13 1317    .  Tolerated the procedure well.  Placed with a foley intraoperatively.     Toradol was given post op.  POD #1, allowed out of bed to a chair.  PT for ambulation and exercise program.  Foley D/C'd in morning.  IV saline locked.  O2 discontionued.  POD #2, continued PT and ambulation.  Dressing changed . POD #3, continued PT and ambulation. Diabetes under control  The remainder of the hospital course was dedicated to ambulation and strengthening.   The patient was discharged on 3 Days Post-Op in  Stable condition.  Blood products given:none  DIAGNOSTIC STUDIES: Recent vital signs:  Patient Vitals for the past 24 hrs:  BP Temp Pulse Resp SpO2  10/28/13 0522 138/56 mmHg 98.8 F (37.1 C) 83 16 96 %  10/27/13 2034 146/56 mmHg 99.8 F (37.7 C) 95 16 95 %  10/27/13 1400 132/47 mmHg 100.3 F (37.9 C) 84 15 100 %  10/27/13 0925 102/51 mmHg - - - -  10/27/13 0923 102/51 mmHg - - - -       Recent laboratory studies:  Recent Labs  10/25/13 1922 10/26/13 0432 10/26/13 2335 10/28/13 0605  WBC 19.8* 15.6* 14.1* 13.6*  HGB 10.8* 10.0* 9.1* 10.5*  HCT 29.7* 27.0* 25.4* 30.0*  PLT 212 201 163 214    Recent Labs  10/25/13 1922 10/26/13 0432 10/27/13 0550 10/28/13 0605  NA 133* 131* 134* 140  K 4.7 5.1 4.4 3.6  CL 100 100 103 104  CO2 20 23 23 25   BUN 30* 31* 28* 19  CREATININE 1.02 1.13* 1.08 0.95  GLUCOSE 321* 243* 162* 90  CALCIUM 8.5 8.3* 8.3* 9.0   Lab Results  Component Value Date   INR 1.01 10/17/2013   INR 1.0 02/20/2009     Recent Radiographic Studies :  Chest 2 View  10/17/2013   CLINICAL DATA:  Pre-admission for left hip replacement  EXAM: CHEST  2 VIEW  COMPARISON:  12/17/2009  FINDINGS: Cardiomediastinal silhouette is stable. No acute infiltrate or pleural effusion. No  pulmonary edema. Bony thorax is unremarkable. Atherosclerotic calcifications of abdominal and thoracic aorta.  IMPRESSION: No active cardiopulmonary disease.   Electronically Signed   By: Natasha Mead M.D.   On: 10/17/2013 15:57   Dg Pelvis Portable  10/25/2013   CLINICAL DATA:  Postop total left hip replacement.  EXAM: PORTABLE PELVIS  COMPARISON:  None.  FINDINGS: Changes of left total hip replacement. No hardware or bony complicating feature. Normal AP alignment.  IMPRESSION: Left hip replacement. No complicating features.   Electronically Signed   By: Caryn Bee  Dover M.D.   On: 10/25/2013 16:26   Dg Chest Port 1 View  10/27/2013   CLINICAL DATA:  Fever and cough  EXAM: PORTABLE CHEST - 1 VIEW  COMPARISON:  10/17/2013  FINDINGS: Normal heart size for technique. Mild bronchitic changes with linear retrocardiac opacity. No definite consolidation, edema, effusion, or pneumothorax. No acute osseous findings.  IMPRESSION: A retrocardiac opacity could be atelectasis or early consolidation.   Electronically Signed   By: Tiburcio Pea M.D.   On: 10/27/2013 01:15   Dg Hip Portable 1 View Left  10/25/2013   CLINICAL DATA:  Postop total left hip replacement.  EXAM: PORTABLE LEFT HIP - 1 VIEW  COMPARISON:  Pelvis imaged performed today.  FINDINGS: Cross-table lateral view demonstrates changes of left hip replacement. Normal alignment. No visible bony complicating feature.  IMPRESSION: Left hip replacement. No visible complicating feature.   Electronically Signed   By: Charlett Nose M.D.   On: 10/25/2013 16:28    DISCHARGE INSTRUCTIONS:     Discharge Orders   Future Orders Complete By Expires   Call MD / Call 911  As directed    Comments:     If you experience chest pain or shortness of breath, CALL 911 and be transported to the hospital emergency room.  If you develope a fever above 101 F, pus (white drainage) or increased drainage or redness at the wound, or calf pain, call your surgeon's office.   Change  dressing  As directed    Comments:     You may change your dressing on SATURDAY, then change the dressing daily with sterile 4 x 4 inch gauze dressing and paper tape.  You may clean the incision with alcohol prior to redressing   Constipation Prevention  As directed    Comments:     Drink plenty of fluids.  Prune juice and/or coffee may be helpful.  You may use a stool softener, such as Colace (over the counter) 100 mg twice a day.  Use MiraLax (over the counter) for constipation as needed but this may take several days to work.  Mag Citrate --OR-- Milk of Magnesia may also be used but follow directions on the label.   Diet general  As directed    Driving restrictions  As directed    Comments:     No driving for 6 weeks   Follow the hip precautions as taught in Physical Therapy  As directed    Increase activity slowly as tolerated  As directed    Lifting restrictions  As directed    Comments:     No lifting for 6 weeks   Patient may shower  As directed    Comments:     You may shower over the brown dressing.  Once the dressing is removed you may shower without a dressing once there is no drainage.  Do not wash over the wound.  If drainage remains, cover wound with plastic wrap and then shower.   TED hose  As directed    Comments:     Use stockings (TED hose) for 1-2 weeks on operative leg(s).  You may remove them at night for sleeping. May stop the NON-operative leg stocking when you go home.   Weight bearing as tolerated  As directed    Questions:     Laterality:     Extremity:        DISCHARGE MEDICATIONS:     Medication List    STOP taking these medications  aspirin EC 81 MG tablet     CRANBERRY EXTRACT PO      TAKE these medications       citalopram 40 MG tablet  Commonly known as:  CELEXA  Take 0.5 tablets (20 mg total) by mouth daily.     Fish Oil 1200 MG Caps  Take 2 capsules by mouth 2 (two) times daily.     insulin aspart 100 UNIT/ML injection  Commonly  known as:  novoLOG  Inject 35-41 Units into the skin 3 (three) times daily before meals. Sliding scale: 90-150=35 units, 151-200=36 units, 201-250=37 units, 251-300=38 units, 301-350=39 units, 351-400=41 units     insulin detemir 100 UNIT/ML injection  Commonly known as:  LEVEMIR  Inject 70-80 Units into the skin 2 (two) times daily. 70 units every morning and 80 units every evening     INVOKANA 100 MG Tabs  Generic drug:  Canagliflozin  Take 100 mg by mouth daily.     levothyroxine 75 MCG tablet  Commonly known as:  SYNTHROID, LEVOTHROID  Take 75 mcg by mouth daily.     lisinopril 40 MG tablet  Commonly known as:  PRINIVIL,ZESTRIL  Take 1 tablet (40 mg total) by mouth daily.     methocarbamol 500 MG tablet  Commonly known as:  ROBAXIN  Take 1 tablet (500 mg total) by mouth every 8 (eight) hours as needed.     multivitamin tablet  Take 1 tablet by mouth daily.     omeprazole 20 MG capsule  Commonly known as:  PRILOSEC  Take 1 capsule (20 mg total) by mouth 2 (two) times daily before a meal.     oxyCODONE 5 MG immediate release tablet  Commonly known as:  Oxy IR/ROXICODONE  Take 1-2 tablets (5-10 mg total) by mouth every 4 (four) hours as needed for pain.     rivaroxaban 10 MG Tabs tablet  Commonly known as:  XARELTO  Take 1 tablet (10 mg total) by mouth daily.     verapamil 120 MG tablet  Commonly known as:  CALAN  Take 1 tablet (120 mg total) by mouth 2 (two) times daily.     vitamin C 500 MG tablet  Commonly known as:  ASCORBIC ACID  Take 500 mg by mouth 2 (two) times daily.     Vitamin D 2000 UNITS tablet  Take 2,000 Units by mouth daily.     Vitamin D3 2000 UNITS Tabs  Take 1 tablet by mouth daily.     zolpidem 10 MG tablet  Commonly known as:  AMBIEN  Take 5 mg by mouth at bedtime as needed for sleep.        FOLLOW UP VISIT:   Follow-up Information   Follow up with [facility], Claude Manges, MD In 2 weeks.   Specialty:  Orthopedic Surgery   Contact  information:   [address] [place] [address] [phone]       DISPOSITION:   Skilled Nursing Facility/Rehab  CONDITION:  Stable   Hibo Blasdell 10/28/2013, 8:25 AM

## 2013-10-27 NOTE — Progress Notes (Signed)
Physical Therapy Treatment Patient Details Name: [name] MRN: [id number] DOB: 06-09-1951 Today's Date: 10/27/2013 Time: 4098-1191 PT Time Calculation (min): 24 min  PT Assessment / Plan / Recommendation  History of Present Illness s/p LTHA; Post Prec   PT Comments   Pt making progress with mobility.  Reports pain as 8/10 but states she has high tolerance to pain as she did not appear to be in excruciating pain with activity.     Follow Up Recommendations  SNF;Supervision/Assistance - 24 hour     Does the patient have the potential to tolerate intense rehabilitation     Barriers to Discharge        Equipment Recommendations  Rolling walker with 5" wheels;3in1 (PT)    Recommendations for Other Services    Frequency 7X/week   Progress towards PT Goals Progress towards PT goals: Progressing toward goals  Plan Current plan remains appropriate    Precautions / Restrictions Precautions Precautions: Posterior Hip Precaution Comments: Reviewed hip precautions Required Braces or Orthoses: Knee Immobilizer - Left Restrictions LLE Weight Bearing: Weight bearing as tolerated   Pertinent Vitals/Pain 8/10 Lt hip.  Repositioned for comfort.  RN notified for pain medication.      Mobility  Bed Mobility Bed Mobility: Not assessed Transfers Transfers: Sit to Stand;Stand to Sit Sit to Stand: 4: Min guard;With upper extremity assist;With armrests;From chair/3-in-1;From toilet Stand to Sit: 4: Min guard;With upper extremity assist;With armrests;To chair/3-in-1;To toilet Details for Transfer Assistance: cues for safe hand placement Ambulation/Gait Ambulation/Gait Assistance: 4: Min guard Ambulation Distance (Feet): 40 Feet Assistive device: Rolling walker Ambulation/Gait Assistance Details: cues for sequencing & for post prec with turns Gait Pattern: Step-to pattern;Step-through pattern;Decreased weight shift to left;Decreased stance time - right;Decreased step length -  left;Narrow base of support General Gait Details: pt ambulates with toes turned inwards.   Stairs: No Wheelchair Mobility Wheelchair Mobility: No    Exercises Total Joint Exercises Ankle Circles/Pumps: AROM;Both;10 reps Heel Slides: AAROM;Left;10 reps Long Arc Quad: AROM;Strengthening;Left;10 reps Marching in Standing: AROM;Strengthening;Left;10 reps;Standing     PT Goals (current goals can now be found in the care plan section) Acute Rehab PT Goals PT Goal Formulation: With patient Time For Goal Achievement: 11/02/13 Potential to Achieve Goals: Good  Visit Information  Last PT Received On: 10/27/13 Assistance Needed: +1 History of Present Illness: s/p LTHA; Post Prec    Subjective Data      Cognition  Cognition Arousal/Alertness: Awake/alert Behavior During Therapy: WFL for tasks assessed/performed Overall Cognitive Status: Within Functional Limits for tasks assessed    Balance     End of Session PT - End of Session Equipment Utilized During Treatment: Gait belt Activity Tolerance: Patient tolerated treatment well Patient left: in chair;with call bell/phone within reach Nurse Communication: Mobility status   GP     Lara Mulch 10/27/2013, 9:11 AM  Verdell Face, PTA [phone] 10/27/2013

## 2013-10-27 NOTE — Evaluation (Signed)
Occupational Therapy Evaluation Patient Details Name: [name] MRN: [id number] DOB: [birth year] Today's Date: 10/27/2013 Time: 4098-1191 OT Time Calculation (min): 29 min  OT Assessment / Plan / Recommendation History of present illness s/p LTHA; Post Prec   Clinical Impression   Pt is overall min assist level for selfcare tasks and toilet transfers.  She does not have any assistance post discharge as she lives alone.  Because of this feel she will definitely need SNF for follow-up therapy in order to reach a modified independent level for return home.      OT Assessment  All further OT needs can be met in the next venue of care    Follow Up Recommendations  SNF       Equipment Recommendations  None recommended by OT          Precautions / Restrictions Precautions Precautions: Posterior Hip Precaution Comments: Reviewed hip precautions Required Braces or Orthoses: Knee Immobilizer - Left Restrictions Weight Bearing Restrictions: No LLE Weight Bearing: Weight bearing as tolerated   Pertinent Vitals/Pain Pt with pain at a 2/10 level on the faces scale in her left hip, pt repositioned at end of session.    ADL  Eating/Feeding: Simulated;Independent Where Assessed - Eating/Feeding: Chair Grooming: Performed;Min guard;Wash/dry hands;Wash/dry face;Teeth care Where Assessed - Grooming: Supported standing Upper Body Bathing: Simulated;Set up Where Assessed - Upper Body Bathing: Unsupported sitting Lower Body Bathing: Performed;Minimal assistance Where Assessed - Lower Body Bathing: Supported sit to stand Upper Body Dressing: Simulated;Set up Where Assessed - Upper Body Dressing: Unsupported sitting Lower Body Dressing: Performed;Minimal assistance (with use of AE for LB dressing) Where Assessed - Lower Body Dressing: Supported sit to stand Toilet Transfer: Simulated;Minimal assistance Toilet Transfer Method: Other (comment) (ambulating with RW) Education administrator: Bedside commode Toileting - Clothing Manipulation and Hygiene: Simulated;Minimal assistance Where Assessed - Engineer, mining and Hygiene: Sit to stand from 3-in-1 or toilet Tub/Shower Transfer Method: Not assessed Equipment Used: Rolling walker;Reacher;Sock aid Transfers/Ambulation Related to ADLs: Pt is able to ambulate slowly with min assist using the RW.  Decreased step length noted as well.  Initially had difficulty advancing the RLE and bearing weight on the LLE but she was eventually able to take small steps. ADL Comments: Pt only able to recall 1/3 THR precautions.  Practiced with use of the reacher and sockaide during session.  Min assist for sit to stand from the bedside chair.    OT Diagnosis: Generalized weakness;Acute pain  OT Problem List: Decreased strength;Impaired balance (sitting and/or standing);Decreased safety awareness;Pain;Decreased knowledge of use of DME or AE OT Treatment Interventions:      Visit Information  Last OT Received On: 10/27/13 Assistance Needed: +1 History of Present Illness: s/p LTHA; Post Prec       Prior Functioning     Home Living Family/patient expects to be discharged to:: Skilled nursing facility Living Arrangements: Alone Prior Function Level of Independence: Independent Communication Communication: No difficulties Dominant Hand: Right         Vision/Perception Vision - History Baseline Vision: Wears glasses only for reading Patient Visual Report: No change from baseline Vision - Assessment Eye Alignment: Within Functional Limits Vision Assessment: Vision not tested Perception Perception: Within Functional Limits Praxis Praxis: Intact   Cognition  Cognition Arousal/Alertness: Awake/alert Behavior During Therapy: WFL for tasks assessed/performed Overall Cognitive Status: Within Functional Limits for tasks assessed Memory: Decreased recall of precautions    Extremity/Trunk Assessment Upper  Extremity Assessment Upper Extremity Assessment: Overall WFL for  tasks assessed Lower Extremity Assessment Lower Extremity Assessment: Defer to PT evaluation Cervical / Trunk Assessment Cervical / Trunk Assessment: Normal     Mobility Bed Mobility Bed Mobility: Not assessed Transfers Transfers: Sit to Stand Sit to Stand: 4: Min assist;With upper extremity assist;From chair/3-in-1 Stand to Sit: 4: Min assist;With upper extremity assist;To chair/3-in-1 Details for Transfer Assistance: Pt needed min instructional cueing for hand placement during sit to stand.     Exercise Total Joint Exercises Ankle Circles/Pumps: AROM;Both;10 reps Heel Slides: AAROM;Left;10 reps Long Arc Quad: AROM;Strengthening;Left;10 Ecologist in Standing: AROM;Strengthening;Left;10 reps;Standing   Balance Balance Balance Assessed: Yes Static Standing Balance Static Standing - Balance Support: Right upper extremity supported;Left upper extremity supported Static Standing - Level of Assistance: 4: Min assist   End of Session OT - End of Session Equipment Utilized During Treatment: Gait belt Activity Tolerance: Patient tolerated treatment well Patient left: with call bell/phone within reach;in chair Nurse Communication: Mobility status     Amela Handley OTR/L 10/27/2013, 10:50 AM

## 2013-10-27 NOTE — Progress Notes (Signed)
Over night, had a fever to 102.7. Currently afebrile.  Subjective: No chills, dysuria, rash. Minimal cough.  Thinks her fever is from getting flu shot yesterday.  Objective: Vital signs in last 24 hours: Temp:  [98.5 F (36.9 C)-102.7 F (39.3 C)] 98.5 F (36.9 C) (10/30 0554) Pulse Rate:  [81-93] 81 (10/30 0554) Resp:  [16-17] 16 (10/30 0554) BP: (97-124)/(40-54) 97/40 mmHg (10/30 0554) SpO2:  [95 %-98 %] 98 % (10/30 0554) Weight change:  Last BM Date: 10/24/13  Intake/Output from previous day: 10/29 0701 - 10/30 0700 In: 1130 [P.O.:630; IV Piggyback:500] Out: -  Intake/Output this shift:    Gen: in chair. Sleepy. oriented Lungs CTA without MGR CV RRR without WRR Abd S, NT, ND Ext no CCE. Bandage in place without noted drainage.  Lab Results:  Recent Labs  10/26/13 0432 10/26/13 2335  WBC 15.6* 14.1*  HGB 10.0* 9.1*  HCT 27.0* 25.4*  PLT 201 163   BMET  Recent Labs  10/26/13 0432 10/27/13 0550  NA 131* 134*  K 5.1 4.4  CL 100 103  CO2 23 23  GLUCOSE 243* 162*  BUN 31* 28*  CREATININE 1.13* 1.08  CALCIUM 8.3* 8.3*    Studies/Results: Dg Pelvis Portable  10/25/2013   CLINICAL DATA:  Postop total left hip replacement.  EXAM: PORTABLE PELVIS  COMPARISON:  None.  FINDINGS: Changes of left total hip replacement. No hardware or bony complicating feature. Normal AP alignment.  IMPRESSION: Left hip replacement. No complicating features.   Electronically Signed   By: Charlett Nose M.D.   On: 10/25/2013 16:26   Dg Chest Port 1 View  10/27/2013   CLINICAL DATA:  Fever and cough  EXAM: PORTABLE CHEST - 1 VIEW  COMPARISON:  10/17/2013  FINDINGS: Normal heart size for technique. Mild bronchitic changes with linear retrocardiac opacity. No definite consolidation, edema, effusion, or pneumothorax. No acute osseous findings.  IMPRESSION: A retrocardiac opacity could be atelectasis or early consolidation.   Electronically Signed   By: Tiburcio Pea M.D.   On:  10/27/2013 01:15   Dg Hip Portable 1 View Left  10/25/2013   CLINICAL DATA:  Postop total left hip replacement.  EXAM: PORTABLE LEFT HIP - 1 VIEW  COMPARISON:  Pelvis imaged performed today.  FINDINGS: Cross-table lateral view demonstrates changes of left hip replacement. Normal alignment. No visible bony complicating feature.  IMPRESSION: Left hip replacement. No visible complicating feature.   Electronically Signed   By: Charlett Nose M.D.   On: 10/25/2013 16:28    Medications:  Scheduled Meds: . Canagliflozin  100 mg Oral Daily  . citalopram  20 mg Oral Daily  . docusate sodium  100 mg Oral BID  . insulin aspart  0-20 Units Subcutaneous TID WC  . insulin aspart  0-5 Units Subcutaneous QHS  . insulin aspart  10 Units Subcutaneous TID WC  . insulin detemir  75 Units Subcutaneous q morning - 10a  . insulin detemir  85 Units Subcutaneous QHS  . levothyroxine  75 mcg Oral QHS  . lisinopril  40 mg Oral Daily  . pantoprazole  40 mg Oral Daily  . rivaroxaban  10 mg Oral Q24H  . verapamil  120 mg Oral BID  . vitamin C  500 mg Oral BID   Continuous Infusions:  PRN Meds:.acetaminophen, acetaminophen, alum & mag hydroxide-simeth, bisacodyl, HYDROmorphone (DILAUDID) injection, magnesium hydroxide, menthol-cetylpyridinium, methocarbamol (ROBAXIN) IV, methocarbamol, metoCLOPramide (REGLAN) injection, metoCLOPramide, ondansetron (ZOFRAN) IV, ondansetron, oxyCODONE, phenol, zolpidem  Imp/Rec Principal Problem:  S/P total hip arthroplasty Fever:  Monitor.  If cough worsens, or continued fever, could start antibiotics to cover lung pathogens, as PCXR shows possible retrocardiac opacity.   HYPOTHYROIDISM   HYPERLIPIDEMIA   HYPERTENSION,    GERD (gastroesophageal reflux disease)   Type II or unspecified type diabetes mellitus without mention of complication, uncontrolled.  Better this am.   Hyponatremia, chronic and stable   LOS: 2 days   Laurenashley Viar L 10/27/2013, 8:57 AM

## 2013-10-27 NOTE — Progress Notes (Signed)
Patient ID: [name], female   DOB: 07-24-1951, 62 y.o.   MRN: [id number] PATIENT ID: [name]        MRN:  [id number]          DOB/AGE: 15-Aug-1951 / 62 y.o.    Cheryl Campbell, MD   [name], PA-C [address] [place], [place]  [address]                             [phone]   PROGRESS NOTE  Subjective:  negative for Chest Pain  negative for Shortness of Breath  negative for Nausea/Vomiting   negative for Calf Pain    Tolerating Diet: yes         Patient reports pain as mild.     Comfortable,service dog in bed with pt, no complaints  Objective: Vital signs in last 24 hours:   Patient Vitals for the past 24 hrs:  BP Temp Temp src Pulse Resp SpO2  10/27/13 0925 102/51 mmHg - - - - -  10/27/13 0923 102/51 mmHg - - - - -  10/27/13 0554 97/40 mmHg 98.5 F (36.9 C) - 81 16 98 %  10/27/13 0400 - - - - 16 95 %  10/27/13 0234 - 99.9 F (37.7 C) Oral - - -  10/27/13 0100 - 100.7 F (38.2 C) Oral - - -  10/27/13 0000 - - - - 16 95 %  10/26/13 2245 - 102.7 F (39.3 C) Oral - - -  10/26/13 2148 - 101.1 F (38.4 C) Oral - - -  10/26/13 2048 124/54 mmHg 102 F (38.9 C) - 93 16 95 %  10/26/13 1952 - - - - 17 -      Intake/Output from previous day:   10/29 0701 - 10/30 0700 In: 1130 [P.O.:630] Out: -    Intake/Output this shift:       Intake/Output     10/29 0701 - 10/30 0700 10/30 0701 - 10/31 0700   P.O. 630    I.V.     IV Piggyback 500    Total Intake 1130     Urine     Stool     Blood     Total Output       Net +1130          Urine Occurrence 3 x 1 x      LABORATORY DATA:  Recent Labs  10/25/13 1922 10/26/13 0432 10/26/13 2335  WBC 19.8* 15.6* 14.1*  HGB 10.8* 10.0* 9.1*  HCT 29.7* 27.0* 25.4*  PLT 212 201 163    Recent Labs  10/25/13 1922 10/26/13 0432 10/27/13 0550  NA 133* 131* 134*  K 4.7 5.1 4.4  CL 100 100 103  CO2 20 23 23   BUN 30* 31* 28*  CREATININE 1.02 1.13* 1.08  GLUCOSE 321* 243* 162*  CALCIUM  8.5 8.3* 8.3*   Lab Results  Component Value Date   INR 1.01 10/17/2013   INR 1.0 02/20/2009    Recent Radiographic Studies :  Chest 2 View  10/17/2013   CLINICAL DATA:  Pre-admission for left hip replacement  EXAM: CHEST  2 VIEW  COMPARISON:  12/17/2009  FINDINGS: Cardiomediastinal silhouette is stable. No acute infiltrate or pleural effusion. No pulmonary edema. Bony thorax is unremarkable. Atherosclerotic calcifications of abdominal and thoracic aorta.  IMPRESSION: No active cardiopulmonary disease.   Electronically Signed   By: Lanette Hampshire.D.  On: 10/17/2013 15:57   Dg Pelvis Portable  10/25/2013   CLINICAL DATA:  Postop total left hip replacement.  EXAM: PORTABLE PELVIS  COMPARISON:  None.  FINDINGS: Changes of left total hip replacement. No hardware or bony complicating feature. Normal AP alignment.  IMPRESSION: Left hip replacement. No complicating features.   Electronically Signed   By: Charlett Nose M.D.   On: 10/25/2013 16:26   Dg Chest Port 1 View  10/27/2013   CLINICAL DATA:  Fever and cough  EXAM: PORTABLE CHEST - 1 VIEW  COMPARISON:  10/17/2013  FINDINGS: Normal heart size for technique. Mild bronchitic changes with linear retrocardiac opacity. No definite consolidation, edema, effusion, or pneumothorax. No acute osseous findings.  IMPRESSION: A retrocardiac opacity could be atelectasis or early consolidation.   Electronically Signed   By: Tiburcio Pea M.D.   On: 10/27/2013 01:15   Dg Hip Portable 1 View Left  10/25/2013   CLINICAL DATA:  Postop total left hip replacement.  EXAM: PORTABLE LEFT HIP - 1 VIEW  COMPARISON:  Pelvis imaged performed today.  FINDINGS: Cross-table lateral view demonstrates changes of left hip replacement. Normal alignment. No visible bony complicating feature.  IMPRESSION: Left hip replacement. No visible complicating feature.   Electronically Signed   By: Charlett Nose M.D.   On: 10/25/2013 16:28     Examination:  General appearance: alert,  cooperative and no distress  Wound Exam: clean, dry, intact   Drainage:  None: wound tissue dry  Motor Exam: EHL, FHL, Anterior Tibial and Posterior Tibial Intact  Sensory Exam: Superficial Peroneal, Deep Peroneal and Tibial normal  Vascular Exam: Normal  Assessment:    2 Days Post-Op  Procedure(s) (LRB): TOTAL HIP ARTHROPLASTY- left (Left)  ADDITIONAL DIAGNOSIS:  Principal Problem:   S/P total hip arthroplasty Active Problems:   HYPOTHYROIDISM   HYPERLIPIDEMIA   HYPERTENSION,    GERD (gastroesophageal reflux disease)   Type II or unspecified type diabetes mellitus without mention of complication, uncontrolled   Hyponatremia   Fever, unspecified  no new problems   Plan: Physical Therapy as ordered Weight Bearing as Tolerated (WBAT)  DVT Prophylaxis:  Xarelto  DISCHARGE PLAN: Skilled Nursing Facility/Rehab  DISCHARGE NEEDS: HHPT, Walker and 3-in-1 comode seat  Dressing changed-wound clean, dry, good effort in PT, will plan to D/C to rehab facility in am, lab stable    [name] W 10/27/2013 4:22 PM

## 2013-10-28 ENCOUNTER — Other Ambulatory Visit: Payer: Self-pay

## 2013-10-28 ENCOUNTER — Other Ambulatory Visit: Payer: Self-pay | Admitting: *Deleted

## 2013-10-28 ENCOUNTER — Inpatient Hospital Stay
Admission: RE | Admit: 2013-10-28 | Discharge: 2013-11-15 | Disposition: A | Discharge status: Home / Self Care | Payer: Medicare Other | Source: Ambulatory Visit | Attending: Internal Medicine | Admitting: Internal Medicine

## 2013-10-28 LAB — GLUCOSE, CAPILLARY
Glucose-Capillary: 112 mg/dL — ABNORMAL HIGH (ref 70–99)
Glucose-Capillary: 92 mg/dL (ref 70–99)
Glucose-Capillary: 141 mg/dL — ABNORMAL HIGH (ref 70–99)

## 2013-10-28 LAB — CBC
MCH: 30.3 pg (ref 26.0–34.0)
MCHC: 35 g/dL (ref 30.0–36.0)
Platelets: 214 10*3/uL (ref 150–400)
RDW: 12.7 % (ref 11.5–15.5)
WBC: 13.6 10*3/uL — ABNORMAL HIGH (ref 4.0–10.5)

## 2013-10-28 LAB — URINE CULTURE

## 2013-10-28 LAB — BASIC METABOLIC PANEL
BUN: 19 mg/dL (ref 6–23)
Calcium: 9 mg/dL (ref 8.4–10.5)
Creatinine, Ser: 0.95 mg/dL (ref 0.50–1.10)
GFR calc Af Amer: 72 mL/min — ABNORMAL LOW (ref >90)
GFR calc non Af Amer: 62 mL/min — ABNORMAL LOW (ref >90)
Glucose, Bld: 90 mg/dL (ref 70–99)

## 2013-10-28 MED ORDER — ZOLPIDEM TARTRATE 10 MG PO TABS: 5.0000 mg | ORAL_TABLET | Freq: Every evening | ORAL | Status: DC | PRN

## 2013-10-28 MED ORDER — ZOLPIDEM TARTRATE 10 MG PO TABS: 5.0000 mg | ORAL_TABLET | Freq: Every evening | ORAL | Status: AC | PRN

## 2013-10-28 MED ORDER — OXYCODONE HCL 5 MG PO TABS: 5.0000 mg | ORAL_TABLET | ORAL | Status: AC | PRN

## 2013-10-28 NOTE — Addendum Note (Signed)
Addendum created 10/28/13 1902 by Rosezella Florida, MD   Modules edited: Anesthesia Attestations

## 2013-10-28 NOTE — Progress Notes (Signed)
Patient ID: [name], female   DOB: 15-Sep-1951, 62 y.o.   MRN: [id number] PATIENT ID: [name]        MRN:  [id number]          DOB/AGE: 18-May-1951 / 62 y.o.    Cheryl Campbell, MD   Cheryl Code, PA-C [address] [place], [place]  [address]                             [phone]   PROGRESS NOTE  Subjective:  negative for Chest Pain  negative for Shortness of Breath  negative for Nausea/Vomiting   negative for Calf Pain    Tolerating Diet: yes         Patient reports pain as mild and moderate.     Wants to go to [facility]  Objective: Vital signs in last 24 hours:   Patient Vitals for the past 24 hrs:  BP Temp Pulse Resp SpO2  10/28/13 0522 138/56 mmHg 98.8 F (37.1 C) 83 16 96 %  10/27/13 2034 146/56 mmHg 99.8 F (37.7 C) 95 16 95 %  10/27/13 1400 132/47 mmHg 100.3 F (37.9 C) 84 15 100 %  10/27/13 0925 102/51 mmHg - - - -  10/27/13 0923 102/51 mmHg - - - -      Intake/Output from previous day:   10/30 0701 - 10/31 0700 In: -  Out: 350 [Urine:350]   Intake/Output this shift:       Intake/Output     10/30 0701 - 10/31 0700 10/31 0701 - 11/01 0700   P.O.     IV Piggyback     Total Intake       Urine 350    Total Output 350     Net -350          Urine Occurrence 5 x       LABORATORY DATA:  Recent Labs  10/25/13 1922 10/26/13 0432 10/26/13 2335 10/28/13 0605  WBC 19.8* 15.6* 14.1* 13.6*  HGB 10.8* 10.0* 9.1* 10.5*  HCT 29.7* 27.0* 25.4* 30.0*  PLT 212 201 163 214    Recent Labs  10/25/13 1922 10/26/13 0432 10/27/13 0550 10/28/13 0605  NA 133* 131* 134* 140  K 4.7 5.1 4.4 3.6  CL 100 100 103 104  CO2 20 23 23 25   BUN 30* 31* 28* 19  CREATININE 1.02 1.13* 1.08 0.95  GLUCOSE 321* 243* 162* 90  CALCIUM 8.5 8.3* 8.3* 9.0   Lab Results  Component Value Date   INR 1.01 10/17/2013   INR 1.0 02/20/2009    Recent Radiographic Studies :  Chest 2 View  10/17/2013   CLINICAL DATA:  Pre-admission for left hip  replacement  EXAM: CHEST  2 VIEW  COMPARISON:  12/17/2009  FINDINGS: Cardiomediastinal silhouette is stable. No acute infiltrate or pleural effusion. No pulmonary edema. Bony thorax is unremarkable. Atherosclerotic calcifications of abdominal and thoracic aorta.  IMPRESSION: No active cardiopulmonary disease.   Electronically Signed   By: Natasha Mead M.D.   On: 10/17/2013 15:57   Dg Pelvis Portable  10/25/2013   CLINICAL DATA:  Postop total left hip replacement.  EXAM: PORTABLE PELVIS  COMPARISON:  None.  FINDINGS: Changes of left total hip replacement. No hardware or bony complicating feature. Normal AP alignment.  IMPRESSION: Left hip replacement. No complicating features.   Electronically Signed   By: Charlett Nose M.D.   On: 10/25/2013 16:26  Dg Chest Port 1 View  10/27/2013   CLINICAL DATA:  Fever and cough  EXAM: PORTABLE CHEST - 1 VIEW  COMPARISON:  10/17/2013  FINDINGS: Normal heart size for technique. Mild bronchitic changes with linear retrocardiac opacity. No definite consolidation, edema, effusion, or pneumothorax. No acute osseous findings.  IMPRESSION: A retrocardiac opacity could be atelectasis or early consolidation.   Electronically Signed   By: Tiburcio Pea M.D.   On: 10/27/2013 01:15   Dg Hip Portable 1 View Left  10/25/2013   CLINICAL DATA:  Postop total left hip replacement.  EXAM: PORTABLE LEFT HIP - 1 VIEW  COMPARISON:  Pelvis imaged performed today.  FINDINGS: Cross-table lateral view demonstrates changes of left hip replacement. Normal alignment. No visible bony complicating feature.  IMPRESSION: Left hip replacement. No visible complicating feature.   Electronically Signed   By: Charlett Nose M.D.   On: 10/25/2013 16:28     Examination:  General appearance: alert, cooperative and mild distress  Wound Exam: clean, dry, intact   Drainage:  None: wound tissue dry  Motor Exam: EHL, FHL, Anterior Tibial and Posterior Tibial Intact  Sensory Exam: Superficial Peroneal,  Deep Peroneal and Tibial normal  Vascular Exam: Left dorsalis pedis artery has trace pulse  Assessment:    3 Days Post-Op  Procedure(s) (LRB): TOTAL HIP ARTHROPLASTY- left (Left)  ADDITIONAL DIAGNOSIS:  Principal Problem:   S/P total hip arthroplasty Active Problems:   HYPOTHYROIDISM   HYPERLIPIDEMIA   HYPERTENSION,    GERD (gastroesophageal reflux disease)   Type II or unspecified type diabetes mellitus without mention of complication, uncontrolled   Hyponatremia   Fever, unspecified  Acute Blood Loss Anemia asymptomatic and Diabetes   Plan: Physical Therapy as ordered Weight Bearing as Tolerated (WBAT)  DVT Prophylaxis:  Xarelto, Foot Pumps and TED hose  DISCHARGE PLAN: Skilled Nursing Facility/Rehab  DISCHARGE NEEDS: HHPT, Walker and 3-in-1 comode seat         [name] 10/28/2013 8:21 AM

## 2013-10-28 NOTE — Progress Notes (Signed)
Report called to [facility] at [facility]. All questions answered.   Medication picked up from pharmacy prior to patient being discharged.    (Patient and family also reporting that a black jacket with pumpkins on it, has been misplaced. Short Stay called, as well as PACU about patient belongings.)

## 2013-10-28 NOTE — Telephone Encounter (Signed)
rx filled per protocol  

## 2013-10-28 NOTE — Progress Notes (Signed)
Physical Therapy Treatment Patient Details Name: [name] MRN: [id number] DOB: [birth year] Today's Date: 10/28/2013 Time: 1006-1030 PT Time Calculation (min): 24 min  PT Assessment / Plan / Recommendation  History of Present Illness s/p LTHA; Post Prec   PT Comments   Pt making progress with mobility but requires cues to reinforce hip precautions with mobility.    Follow Up Recommendations  SNF;Supervision/Assistance - 24 hour     Does the patient have the potential to tolerate intense rehabilitation     Barriers to Discharge        Equipment Recommendations  Rolling walker with 5" wheels;3in1 (PT)    Recommendations for Other Services    Frequency 7X/week   Progress towards PT Goals Progress towards PT goals: Progressing toward goals  Plan Current plan remains appropriate    Precautions / Restrictions Precautions Precautions: Posterior Hip Precaution Comments: Reviewed hip precautions Required Braces or Orthoses: Knee Immobilizer - Left Restrictions LLE Weight Bearing: Weight bearing as tolerated   Pertinent Vitals/Pain 7/10 Lt hip.  Premedicated.      Mobility  Bed Mobility Bed Mobility: Supine to Sit;Sitting - Scoot to Edge of Bed Supine to Sit: HOB flat;4: Min guard Sitting - Scoot to [place] of Bed: 4: Min guard Details for Bed Mobility Assistance: tactile & verbal cues to reinforce hip precautions.   Transfers Transfers: Sit to Stand;Stand to Sit Sit to Stand: 4: Min guard;With upper extremity assist;From bed;From toilet Stand to Sit: 4: Min guard;With upper extremity assist;With armrests;To chair/3-in-1;To toilet Details for Transfer Assistance: cues for hand placement & LLE positioning Ambulation/Gait Ambulation/Gait Assistance: 4: Min guard Ambulation Distance (Feet): 70 Feet Assistive device: Rolling walker Ambulation/Gait Assistance Details: cues for sequencing, increase WBing LLE, & hip precautions with turns.  Pt beginning to progress to  step-through gait pattern.   Gait Pattern: Step-to pattern;Step-through pattern;Decreased stride length;Decreased step length - right;Decreased weight shift to left;Narrow base of support Gait velocity: decreased General Gait Details: pt ambulates with toes turned inwards.   Stairs: No Wheelchair Mobility Wheelchair Mobility: No    Exercises Total Joint Exercises Ankle Circles/Pumps: AROM;Both;10 reps Gluteal Sets: AROM;Both;5 reps Heel Slides: AROM;Strengthening;Left;10 reps Hip ABduction/ADduction: AAROM;Strengthening;Left;10 reps Straight Leg Raises: AAROM;Left;Strengthening;10 reps     PT Goals (current goals can now be found in the care plan section) Acute Rehab PT Goals PT Goal Formulation: With patient Time For Goal Achievement: 11/02/13 Potential to Achieve Goals: Good  Visit Information  Last PT Received On: 10/28/13 Assistance Needed: +1 History of Present Illness: s/p LTHA; Post Prec    Subjective Data      Cognition  Cognition Arousal/Alertness: Awake/alert Behavior During Therapy: WFL for tasks assessed/performed Overall Cognitive Status: Within Functional Limits for tasks assessed    Balance     End of Session PT - End of Session Equipment Utilized During Treatment: Gait belt Activity Tolerance: Patient tolerated treatment well Patient left: in chair;with call bell/phone within reach Nurse Communication: Mobility status   GP     Lara Mulch 10/28/2013, 10:42 AM  Verdell Face, PTA [phone] 10/28/2013

## 2013-10-29 LAB — GLUCOSE, CAPILLARY
Glucose-Capillary: 113 mg/dL — ABNORMAL HIGH (ref 70–99)
Glucose-Capillary: 79 mg/dL (ref 70–99)

## 2013-10-30 ENCOUNTER — Non-Acute Institutional Stay (SKILLED_NURSING_FACILITY): Payer: Medicare Other | Admitting: Internal Medicine

## 2013-10-30 DIAGNOSIS — Z794 Long term (current) use of insulin: Secondary | ICD-10-CM

## 2013-10-30 DIAGNOSIS — F329 Major depressive disorder, single episode, unspecified: Secondary | ICD-10-CM

## 2013-10-30 DIAGNOSIS — K5900 Constipation, unspecified: Secondary | ICD-10-CM

## 2013-10-30 DIAGNOSIS — I10 Essential (primary) hypertension: Secondary | ICD-10-CM

## 2013-10-30 DIAGNOSIS — K219 Gastro-esophageal reflux disease without esophagitis: Secondary | ICD-10-CM

## 2013-10-30 DIAGNOSIS — E119 Type 2 diabetes mellitus without complications: Secondary | ICD-10-CM

## 2013-10-30 DIAGNOSIS — R509 Fever, unspecified: Secondary | ICD-10-CM

## 2013-10-30 DIAGNOSIS — E039 Hypothyroidism, unspecified: Secondary | ICD-10-CM

## 2013-10-30 DIAGNOSIS — E871 Hypo-osmolality and hyponatremia: Secondary | ICD-10-CM

## 2013-10-30 DIAGNOSIS — Z96649 Presence of unspecified artificial hip joint: Secondary | ICD-10-CM

## 2013-10-30 LAB — GLUCOSE, CAPILLARY
Glucose-Capillary: 214 mg/dL — ABNORMAL HIGH (ref 70–99)
Glucose-Capillary: 85 mg/dL (ref 70–99)
Glucose-Capillary: 146 mg/dL — ABNORMAL HIGH (ref 70–99)

## 2013-10-30 NOTE — Progress Notes (Signed)
Patient ID: NICKI FURLAN, female   DOB: 1951-03-01, 62 y.o.   MRN: [id number]   This is an acute visit.  Level of care skilled.  Facility [facility].  Chief complaint-acute visit status post hospitalization for left hip replacement.  History of present illness.  Patient is a very pleasant 62 year old female with a history of groin pain that moved up into her pelvis.  It had gotten to the point she had to use a cane as well as heat and hydrocodone.  It did affect her sleeping and walking and became progressively worse.  It is of the pain an MRI and vascular studies were ordered.  MRI did show inflammatory changes in the left hip with effusion and synovitis.  Also showed severe osteoarthritis and degenerative changes no evidence of avascular necrosis.  There was a cystic lesion in the left pelvis pelvic ultrasound and trans-vaginal ultrasound of the pelvis showed normal blood flow and no masses in either ovary.  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  Subsequently she underwent the left hip replacement secondary to suspected severe osteoarthritis.  Apparently she tolerated the procedure fairly well and she is here for rehabilitation.  Other medical issues include diabetes type 2--she is on a high dose of Levemir 70 units in the morning 80 units at night also an aggressive sliding scale with 35 units of NovoLog for blood sugar between 151-200 this goes up to 41 units for blood sugar of 351-400.  So far her morning sugars are 113 and 134.  At noon 79-214.  At 4 PM 149-85.  She also has some history of leukocytosis apparently and fever of unknown origin in the hospital although details of this is fairly sketchy-she tells me she got a flu shot and that's when she developed the occasional temperatures-apparently a couple nights ago she did have a temperature of 100.0-lab work  was ordered which was fairly unremarkable her white count was elevated at 13.6 but this was baseline with previous hospital lab the day before and actually is trending down from a high of 19.8 on October 28.  The granulocytes the most recent lab was 9.1 which was mildly elevated.  She has been afebrile since then-we did obtain blood cultures which have been negative so far also a urine was ordered--results are pending.  Also a chest x-ray was ordered which showed a retrocardiac opacity atelectasis versus early consolidation-she is not running a temperature since then does not complaining any cough or shortness of breath.  Tonight she has no acute complaints-she is very motivated to do rehabilitation so she can start walking again and obtain independent status.  Previous medical history.  Her one-status post total hip arthroplasty secondary to osteoarthritis.  Hypertension.  Hypothyroidism.  Hyperlipidemia.  GERD.  Diabetes type 2.  Fever unspecified.  Chronic hyponatremia.  Depression.  Diverticulitis.  Internal hemorrhoids.  Past surgical history.  Partial hysterectomy.  Bladder-surgery.  Carpal tunnel release--bilateral.  Bilateral arm surgery.  Abdominal lipoma excision.  Tonsillectomy.  Removal of goiter.  Cataract extraction bilateral  Hemorrhoid banding.  Ovarian cyst removal.  Joint replacement right middle finger.  Dilation and curettage of uterus.  She did have a colonoscopy June 2004-that showed internal hemorrhoids mild diverticulosis the descending colon and moderate diverticulosis in the descending colon and sigmoid colon.  2 small rectal polyps in the colon  redundant hyperplastic polyp  Hospital studies.  10/17/2013.  Chest x-ray that showed no active cardiopulmonary disease  10/27/2013.  Chest x-ray that showed retrocardiac opacity could be atelectasis or early consolidation  Medications.  Celexa 20 mg daily.  Fish oil 1200  mg 2 capsules twice a day.  NovoLog sliding scale.  Levemir 70 units every morning-80 units every afternoon.  Invokiana-100 mg daily.  Synthroid 75 mcg daily.  Lisinopril 40 mg daily.  Robaxin 500 mg every 8 hours when necessary.  Multivitamin daily.  Prilosec 20 mg twice a day.  OxyIR 5 mg every 4 hours when necessary one-or 2 tabs   Xeralto 10 mg daily.  Verapamil 120 mg twice a day.  Vitamin C 500 mg twice a day.  Vitamin D 2000 units daily.  Vitamin D 3 2000 units daily.  Ambien 5 mg each bedtime when necessary insomnia .       Social history.  She is a widow-no history of tobacco use.  Or alcohol or illicit drug use.  She had lived independently before and hopes certainly to return to that level.  Family history  Mother had history of CVA diabetes and hypertension.  Father had history of heart disease hyperlipidemia.  She has a brother with history of diabetes hypertension.   Review of systems her  General denies any fever or chills.   skin-does not complaining of rash or itching.  Head ears eyes nose mouth and throat-no complaints of visual changes she has numerous extractions does not complaining of any sore throat or nasal discharge.  Respiratory no complaints of shortness of breath does not complaining of cough.  Cardiac does not complaining of chest pain or palpitations.  GI-states she has constipation this is not new but at this point defers any check for impaction.  GU-no complaints of dysuria.  Muscle skeletal-does not complain of joint pain is status post left hip replacement apparently this is currently well controlled pain-wise.  Neurologic no complaints of headache dizziness or numbness.  Psych-some listed history of depression she appears to be in good spirits.  Physical exam.  Temperature is 98.1 pulse 76 respirations 20 blood pressure 120/62 weight is 110.5.  General this is a pleasant female in no distress resting  comfortably in bed she is pleasant smiling laughing.  Her skin is warm and dry surgical site left hip is covered with dry dressing this is addressed by nursing on a daily basis --no sign of infection has been noted.  Eyes pupils appear equal round reactive to light sclera and conjunctiva are clear visual acuity is grossly intact.  Oropharynx is clear she has numerous extractions mucous membranes are moist.  Chest is clear to auscultation without any rhonchi rales or wheezes no labored breathing.  Heart is regular rate and rhythm without murmur gallop or rub-she does not have significant lower extremity edema-pedal pulses are intact bilaterally.  Abdomen is soft mildly obese and protuberant-bowel sounds are active-abdomen is nontender.  Rectal-exam for impaction deferred.  Extremities-moves all extremities x4 he is able actually to bend her left knee without significant pain I do not note any deformities of any extremities she does move all extremities.  Neurologic is grossly intact cranial nerves intact her speech is clear no lateralizing findings.  Psych she is alert and oriented x3 pleasant and appropriate laughing smiling in good spirits.  Labs.  10/29/2013.  WBC 13.6 hemoglobin 9.4 platelets 233 granulocytes 9.1.  Sodium 134 potassium 4 BUN 20 creatinine 1.05  10/17/2013-.  Liver function tests within normal limits.  10/25/2013.  Hemoglobin A1c-9.0   Assessment and plan.  #1-status post left hip arthroplasty with history of osteoarthritis-this appears to be progressing well she is receiving OxyIR as needed for pain as well as Robaxin-she is onXeralto---will need continued PT and OT --I suspect her stay here will be relatively short.  #2 diabetes type 2-this didn't appear to be under real good control prehospitalization-she is on high doses of Levemir as well as NovoLog sliding scale-we have fairly minimal reading so far but will continue to monitor this clinically she  appears stable--hemoglobin A1c was 9.0.  #3-hypertension-appears this is been stable she is on lisinopril as well as verapamil.  #4-hypothyroidism she is on Synthroid Will update TSH.  #5-GERD this appears to be stable on Prilosec.  #6-hyponatremia-per chart review this appears to be chronic in relative baseline Will update this later thist week.  #7-history of depression-this appears stable on Celexa-it appears the hospital  considered discontinuing  Celexa secondary to hyponatremia but she continues on this and sodium has been at baseline  #8-fever of unknown origin-and appears white count is trending down we will update this later this week  she does not show any signs of infection we have obtained a urinalysis this appears to be fairly benign--rare bacteria negative nitrites and leukocyte esterase--this does not appear to be respiratory so far  chest x-ray showed atelectasis versus early consolidation she does not appear to be symptomatic  #9  vitamin D deficiency-he is on supplementation Will update vitamin D level  #9 -- constipation-continue to monitor Will add MiraLax daily hold for diarrhea.--If no BM by tomorrow notify provider   [phone] note greater than 40 minutes spent assessing patient-reviewing her hospital records--and coordinating and formulating a plan of care for numerous diagnoses      .    Marland Kitchen

## 2013-10-31 ENCOUNTER — Other Ambulatory Visit (HOSPITAL_COMMUNITY): Payer: Medicare Other

## 2013-10-31 ENCOUNTER — Telehealth: Payer: Self-pay | Admitting: Gastroenterology

## 2013-10-31 ENCOUNTER — Non-Acute Institutional Stay (SKILLED_NURSING_FACILITY): Payer: Medicare Other | Admitting: Internal Medicine

## 2013-10-31 DIAGNOSIS — Z96649 Presence of unspecified artificial hip joint: Secondary | ICD-10-CM

## 2013-10-31 DIAGNOSIS — E119 Type 2 diabetes mellitus without complications: Secondary | ICD-10-CM

## 2013-10-31 DIAGNOSIS — R509 Fever, unspecified: Secondary | ICD-10-CM

## 2013-10-31 DIAGNOSIS — Z794 Long term (current) use of insulin: Secondary | ICD-10-CM

## 2013-10-31 LAB — GLUCOSE, CAPILLARY
Glucose-Capillary: 125 mg/dL — ABNORMAL HIGH (ref 70–99)
Glucose-Capillary: 106 mg/dL — ABNORMAL HIGH (ref 70–99)
Glucose-Capillary: 63 mg/dL — ABNORMAL LOW (ref 70–99)
Glucose-Capillary: 166 mg/dL — ABNORMAL HIGH (ref 70–99)

## 2013-10-31 NOTE — Telephone Encounter (Signed)
Message copied by Tiffany Kocher on Mon Oct 31, 2013 12:46 PM ------      Message from: [place]      Created: Fri Oct 07, 2013  3:16 PM       START WITH HFP IF NL NO ADDITIONAL WORKUP NEEDED.      ----- Message -----         From: Tiffany Kocher, PA-C         Sent: 10/07/2013   2:19 PM           To: [place], MD            Incidental finding of hepatomegaly while inpatient for diverticulitis. How do you want to manage this? Any f/u needed?       ------

## 2013-10-31 NOTE — Clinical Social Work Psychosocial (Signed)
Clinical Social Work Department  BRIEF PSYCHOSOCIAL ASSESSMENT  Patient:[name]  Account Number: [id number]   Admit date: 10/25/13 Clinical Social Worker Sabino Niemann, MSW Date/Time:  Referred by: Physician Date Referred: 10/25/13 Referred for   SNF Placement   Other Referral:  Interview type: Patient  Other interview type: PSYCHOSOCIAL DATA  Living Status:Alone Admitted from facility:  Level of care:  Primary support name: Davenporth,Beth  Primary support relationship to patient: Friend Degree of support available:  Strong and vested  CURRENT CONCERNS  Current Concerns   Post-Acute Placement   Other Concerns:  SOCIAL WORK ASSESSMENT / PLAN  CSW met with pt re: PT recommendation for SNF.   Pt lives alone  CSW explained placement process and answered questions.   Pt reports [facility]  as her preference    CSW completed FL2 and initiated SNF search.     Assessment/plan status: Information/Referral to [facility]  Other assessment/ plan:  Information/referral to community resources:  SNF     PATIENT'S/FAMILY'S RESPONSE TO PLAN OF CARE:  Pt  reports she is agreeable to ST SNF in order to increase strength and independence with mobility prior to returning home  Pt verbalized understanding of placement process and appreciation for CSW assist.   Sabino Niemann, MSW, LCSWA [phone]

## 2013-10-31 NOTE — Progress Notes (Signed)
Clinical social worker assisted with patient discharge to skilled nursing facility, [facility].  CSW addressed all family questions and concerns. CSW copied chart and added all important documents. CSW also set up patient transportation with [facility]. Clinical Social Worker will sign off for now as social work intervention is no longer needed.   Eldo Umanzor, MSW, LCSWA [phone] 

## 2013-10-31 NOTE — Clinical Social Work Placement (Signed)
Clinical Social Work Department  CLINICAL SOCIAL WORK PLACEMENT NOTE    Patient: [name]  Account Number: [id number]   Admit date: 10/25/13 Clinical Social Worker: Sabino Niemann LCSWA Date/time: 10/25/13 11:30 AM  Clinical Social Work is seeking post-discharge placement for this patient at the following level of care: SKILLED NURSING (*CSW will update this form in Epic as items are completed)  10/25/13  Patient/family provided with [facility] Department of Clinical Social Work's list of facilities offering this level of care within the geographic area requested by the patient (or if unable, by the patient's family).  10/25/13  Patient/family informed of their freedom to choose among providers that offer the needed level of care, that participate in Medicare, Medicaid or managed care program needed by the patient, have an available bed and are willing to accept the patient.  10/25/13 Patient/family informed of [facility]' ownership interest in [facility], as well as of the fact that they are under no obligation to receive care at this facility.  PASARR submitted to EDS on 10/25/13  PASARR number received from EDS on 10/25/13  FL2 transmitted to all facilities in geographic area requested by pt/family on 10/25/13  FL2 transmitted to all facilities within larger geographic area on  Patient informed that his/her managed care company has contracts with or will negotiate with certain facilities, including the following:  Patient/family informed of bed offers received: 10/25/13  Patient chooses bed at [facility] Physician recommends and patient chooses bed at  Patient to be transferred to on 10/28/13 Patient to be transferred to facility by Private Vehicle The following physician request were entered in Epic:  Additional Comments:

## 2013-11-01 ENCOUNTER — Encounter: Payer: Self-pay | Admitting: Internal Medicine

## 2013-11-01 LAB — GLUCOSE, CAPILLARY
Glucose-Capillary: 174 mg/dL — ABNORMAL HIGH (ref 70–99)
Glucose-Capillary: 151 mg/dL — ABNORMAL HIGH (ref 70–99)
Glucose-Capillary: 79 mg/dL (ref 70–99)
Glucose-Capillary: 158 mg/dL — ABNORMAL HIGH (ref 70–99)
Glucose-Capillary: 102 mg/dL — ABNORMAL HIGH (ref 70–99)

## 2013-11-01 NOTE — Progress Notes (Signed)
Patient ID: [name], female   DOB: February 16, 1951, 62 y.o.   MRN: [id number]  This encounter was created in error - please disregard.

## 2013-11-02 LAB — CULTURE, BLOOD (ROUTINE X 2): Culture: NO GROWTH

## 2013-11-02 LAB — GLUCOSE, CAPILLARY: Glucose-Capillary: 197 mg/dL — ABNORMAL HIGH (ref 70–99)

## 2013-11-03 LAB — GLUCOSE, CAPILLARY
Glucose-Capillary: 146 mg/dL — ABNORMAL HIGH (ref 70–99)
Glucose-Capillary: 179 mg/dL — ABNORMAL HIGH (ref 70–99)
Glucose-Capillary: 315 mg/dL — ABNORMAL HIGH (ref 70–99)

## 2013-11-04 LAB — GLUCOSE, CAPILLARY: Glucose-Capillary: 197 mg/dL — ABNORMAL HIGH (ref 70–99)

## 2013-11-05 LAB — GLUCOSE, CAPILLARY
Glucose-Capillary: 137 mg/dL — ABNORMAL HIGH (ref 70–99)
Glucose-Capillary: 138 mg/dL — ABNORMAL HIGH (ref 70–99)

## 2013-11-06 LAB — GLUCOSE, CAPILLARY
Glucose-Capillary: 127 mg/dL — ABNORMAL HIGH (ref 70–99)
Glucose-Capillary: 276 mg/dL — ABNORMAL HIGH (ref 70–99)

## 2013-11-07 LAB — GLUCOSE, CAPILLARY
Glucose-Capillary: 105 mg/dL — ABNORMAL HIGH (ref 70–99)
Glucose-Capillary: 264 mg/dL — ABNORMAL HIGH (ref 70–99)

## 2013-11-07 NOTE — Progress Notes (Signed)
Patient ID: [name], female   DOB: [birth year], 62 y.o.   MRN: [id number]           HISTORY & PHYSICAL  DATE:  10/31/2013  FACILITY: [facility]    LEVEL OF CARE:   SNF         CHIEF COMPLAINT:   Status post admission to [facility], 10/25/2013 through 10/28/2013.    HISTORY OF PRESENT ILLNESS:   This is a patient who was admitted electively secondary to severe left hip osteoarthritis and she underwent a total hip arthroplasty.  It would appear that there was some initial uncertainty about the exact diagnosis as she had a significant vascular work-up.  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  An MRI of the pelvis revealed marked inflammatory changes in the left hip with an effusion and cellulitis, severe osteoarthritis, and degenerative change with no evidence of avascular necrosis.   Ultrasound of the aorta showed no evidence of an aneurysm.  The patient was admitted and underwent a left total hip arthroplasty, which she appears to have tolerated well without issues.     After arrival here, she spiked a fever of 101.  Blood cultures were done, which are currently negative.   Urine for C&S is pending.     PAST MEDICAL HISTORY/PROBLEM LIST:  Type 2 diabetes.  On an aggressive insulin regimen.  The patient tells me that she had an insulin pump up until last year.  She is followed by Endocrinology.    Hypertension.     Hypercholesterolemia.    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    Recent episode of acute diverticulitis in August 2014.    History of internal hemorrhoids.    Gastroesophageal reflux disease.    History of depression.    Urinary frequency.    CURRENT MEDICATIONS:  Medication list is reviewed.    Robaxin 500 q.8 p.r.n.    Oxycodone 5 mg q.4 p.r.n.    Xarelto 10 mg daily (DVT prophylaxis).    Celexa 20 mg daily.    Fish oil 2 capsules two times daily.     Aggressive NovoLog sliding scale a.c. meals:   90-150, 35 U; 151-200, 36 U; 201-250, 37 U; 251-300, 38 U; 301-350, 39 U; 351-400, 41 U.     She is on Levemir 70 U in the morning and 80 U in the evening.    Invokana 100 mg daily.    Synthroid 75 q.d.    Prinivil 40 q.d.    Omeprazole 20 q.d.    Verapamil 120 mg b.i.d.    Ambien 5 mg q.h.s. p.r.n.    Vitamin D3, 2000 U daily.    SOCIAL HISTORY: HOUSING:  The patient lives in her own apartment in [place].   FUNCTIONAL STATUS:   She is independent with ADLs and IADLs.  She used a cane before she came in.  She is capable of giving herself her own insulin.   TOBACCO USE:  She is a nonsmoker.    FAMILY HISTORY: MOTHER:  Diabetes in her mother.   FATHER:  Hypertension in her father.    REVIEW OF SYSTEMS:   CHEST/RESPIRATORY:  She does not have cough or shortness of breath.   CARDIAC:   No chest pain.   GI:  Recent history of diverticulitis, which she states happened while she was overeating at a wedding in [place].  She has internal hemorrhoids,  had some rectal bleeding, underwent a colonoscopy.   GU:  No clear dysuria.    PHYSICAL EXAMINATION:   GENERAL APPEARANCE:  The patient is not in any distress.   CHEST/RESPIRATORY:  Clear air entry bilaterally.   CARDIOVASCULAR:  CARDIAC:   Heart sounds are normal.  She has a 2/6 soft blowing murmur compatible with mitral insufficiency.  She has a soft left carotid bruit.   NECK/THYROID:  Thyroidectomy scar noted.   GASTROINTESTINAL:  ABDOMEN:   Slightly distended.   LIVER/SPLEEN/KIDNEYS:  No liver, no spleen.  No tenderness.   GENITOURINARY:  BLADDER:   No suprapubic fullness or tenderness.  No CVA tenderness.   CIRCULATION:  EDEMA/VARICOSITIES:   No peripheral edema.   NEUROLOGICAL:    DEEP TENDON REFLEXES:  Reflexes are intact.   MUSCULOSKELETAL:   EXTREMITIES:   LEFT LOWER EXTREMITY:  Her left hip incision looks stable.    ASSESSMENT/PLAN:  Status post left total  hip replacement.  Everything looks fine here.  Incision is clean.  No infection.    Fever on arrival to the building three days ago.  The source of this is not clear.  Nevertheless, she is afebrile and her exam is normal.  I think the urine culture is still pending.    Type 2 diabetes.  She is on an aggressive insulin regimen.  The patient tells me her hemoglobin A1c was recently 8.8.  She has had a lot of difficulties in this regard, including discontinuation of her insulin pump a year ago.  She tells me she has not had any hypoglycemic reaction.    Overall, this patient looks very stable.  She is probably going to have a short period of rehabilitation.   She is on Xarelto for, I believe, DVT prophylaxis.  I will need to clarify this with her.    CPT CODE: 16109

## 2013-11-08 LAB — GLUCOSE, CAPILLARY
Glucose-Capillary: 106 mg/dL — ABNORMAL HIGH (ref 70–99)
Glucose-Capillary: 135 mg/dL — ABNORMAL HIGH (ref 70–99)

## 2013-11-09 LAB — GLUCOSE, CAPILLARY
Glucose-Capillary: 100 mg/dL — ABNORMAL HIGH (ref 70–99)
Glucose-Capillary: 231 mg/dL — ABNORMAL HIGH (ref 70–99)
Glucose-Capillary: 180 mg/dL — ABNORMAL HIGH (ref 70–99)

## 2013-11-10 LAB — GLUCOSE, CAPILLARY
Glucose-Capillary: 216 mg/dL — ABNORMAL HIGH (ref 70–99)
Glucose-Capillary: 161 mg/dL — ABNORMAL HIGH (ref 70–99)
Glucose-Capillary: 211 mg/dL — ABNORMAL HIGH (ref 70–99)

## 2013-11-11 LAB — GLUCOSE, CAPILLARY: Glucose-Capillary: 196 mg/dL — ABNORMAL HIGH (ref 70–99)

## 2013-11-12 LAB — GLUCOSE, CAPILLARY: Glucose-Capillary: 130 mg/dL — ABNORMAL HIGH (ref 70–99)

## 2013-11-13 LAB — GLUCOSE, CAPILLARY: Glucose-Capillary: 207 mg/dL — ABNORMAL HIGH (ref 70–99)

## 2013-11-14 LAB — GLUCOSE, CAPILLARY
Glucose-Capillary: 205 mg/dL — ABNORMAL HIGH (ref 70–99)
Glucose-Capillary: 153 mg/dL — ABNORMAL HIGH (ref 70–99)

## 2013-11-15 ENCOUNTER — Non-Acute Institutional Stay (SKILLED_NURSING_FACILITY): Payer: Medicare Other | Admitting: Internal Medicine

## 2013-11-15 DIAGNOSIS — R509 Fever, unspecified: Secondary | ICD-10-CM

## 2013-11-15 DIAGNOSIS — F329 Major depressive disorder, single episode, unspecified: Secondary | ICD-10-CM

## 2013-11-15 DIAGNOSIS — E119 Type 2 diabetes mellitus without complications: Secondary | ICD-10-CM

## 2013-11-15 DIAGNOSIS — K219 Gastro-esophageal reflux disease without esophagitis: Secondary | ICD-10-CM

## 2013-11-15 DIAGNOSIS — E039 Hypothyroidism, unspecified: Secondary | ICD-10-CM

## 2013-11-15 DIAGNOSIS — I10 Essential (primary) hypertension: Secondary | ICD-10-CM

## 2013-11-15 DIAGNOSIS — F3289 Other specified depressive episodes: Secondary | ICD-10-CM

## 2013-11-15 DIAGNOSIS — Z794 Long term (current) use of insulin: Secondary | ICD-10-CM

## 2013-11-15 DIAGNOSIS — E871 Hypo-osmolality and hyponatremia: Secondary | ICD-10-CM

## 2013-11-15 DIAGNOSIS — Z96649 Presence of unspecified artificial hip joint: Secondary | ICD-10-CM

## 2013-11-15 LAB — GLUCOSE, CAPILLARY
Glucose-Capillary: 255 mg/dL — ABNORMAL HIGH (ref 70–99)
Glucose-Capillary: 230 mg/dL — ABNORMAL HIGH (ref 70–99)

## 2013-11-15 NOTE — Progress Notes (Signed)
Patient ID: [name], female   DOB: April 21, 1951, 62 y.o.   MRN: [id number] This is an acute visit.  Level of care skilled.  Facility [facility]   Chief complaint-discharge note .  History of present illness .  Patient is a very pleasant 62 year old female with a history of groin pain that moved up into her pelvis.  It had gotten to the point she had to use a cane as well as heat and hydrocodone.  It did affect her sleeping and walking and became progressively worse.  It is of the pain an MRI and vascular studies were ordered.  MRI did show inflammatory changes in the left hip with effusion and synovitis.  Also showed severe osteoarthritis and degenerative changes no evidence of avascular necrosis.  There was a cystic lesion in the left pelvis pelvic ultrasound and trans-vaginal ultrasound of the pelvis showed normal blood flow and no masses in either ovary.  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  Subsequently she underwent the left hip replacement secondary to suspected severe osteoarthritis.  Apparently she tolerated the procedure fairly well and she was here for rehabilitation--and has done quite well.  She did spike a temperature shortly after admission  actually had one on and off in the hospital as well- blood cultures were negative but the urine did grow out 50,000 colonies of a Hafnia Alvei----she was treated with Septra and apparently fever largely resolved she is not complaining of any chills or fever today.--Do see a recent 99.4 reading but this appears to be the only elevation in recent days  Other medical issues include diabetes type 2--she is on a high dose of Levemir 70 units in the morning 80 units at night also an aggressive sliding scale--her sugars appear to be run largely in the mid 100s with occasional spikes over 200 but these are not consistent  As noted above  she did have some history of low-grade temperature and leukocytosis apparently this was also in the hospital workup was only concerning for possible UTI with 50,000 colonies of Hafnia Alvei--this was treated secondary to leukocytosis and low-grade temperatures-this appears to be stable now.  She denies any fever or chills.. -- very motivated to go home she will need continued therapy as an outpatient.   .  Previous medical history.  Her one-status post total hip arthroplasty secondary to osteoarthritis.  Hypertension.  Hypothyroidism.  Hyperlipidemia.  GERD.  Diabetes type 2.  Fever unspecified.  Chronic hyponatremia.  Depression.  Diverticulitis.  Internal hemorrhoids.  Past surgical history.  Partial hysterectomy.  Bladder-surgery.  Carpal tunnel release--bilateral.  Bilateral arm surgery.  Abdominal lipoma excision.  Tonsillectomy.  Removal of goiter.  Cataract extraction bilateral  Hemorrhoid banding.  Ovarian cyst removal.  Joint replacement right middle finger.  Dilation and curettage of uterus.  She did have a colonoscopy June 2004-that showed internal hemorrhoids mild diverticulosis the descending colon and moderate diverticulosis in the descending colon and sigmoid colon.  2 small rectal polyps in the colon redundant hyperplastic polyp   Hospital studies.  10/17/2013.  Chest x-ray that showed no active cardiopulmonary disease  10/27/2013.  Chest x-ray that showed retrocardiac opacity could be atelectasis or early consolidation   Medications.  Celexa 20 mg daily.  Fish oil 1200 mg 2 capsules twice a day.  NovoLog sliding scale.  Levemir 70 units every morning-80 units every afternoon.  Invokiana-100 mg  daily.  Synthroid 75 mcg daily.  Lisinopril 40 mg daily.  Robaxin 500 mg every 8 hours when necessary.  Multivitamin daily.  Prilosec 20 mg twice a day.  Oxycodone  5 mg every 4 hours when necessary one-or 2 tabs  Xeralto 10 mg daily.  Verapamil 120 mg twice  a day.  Vitamin C 500 mg twice a day.  Vitamin D 2000 units daily.  Vitamin D 3 2000 units daily.  Ambien 5 mg each bedtime when necessary insomnia  .  Social history.  She is a widow-no history of tobacco use.  Or alcohol or illicit drug use.  She lives independently .  Family history  Mother had history of CVA diabetes and hypertension.  Father had history of heart disease hyperlipidemia.  She has a brother with history of diabetes hypertension.   Review of systems   General denies any fever or chills.  skin-does not complaining of rash or itching.  Head ears eyes nose mouth and throat-no complaints of visual changes she has numerous extractions does not complaining of any sore throat or nasal discharge.  Respiratory no complaints of shortness of breath does not complaining of cough.  Cardiac does not complaining of chest pain or palpitations.  GI-does not complain of any abdominal pain nausea vomiting diarrhea or constipation had complained of constipation in the past.  GU-no complaints of dysuria.  Muscle skeletal-does not complain of joint pain is status post left hip replacement apparently this is currently well controlled pain-wise.  Neurologic no complaints of headache dizziness or numbness.  Psych-some listed history of depression she appears to be in good spirits .  Physical exam.  Temperature 98.4 pulse 84 respirations 18 blood pressure 115/48 .  General this is a pleasant female in no distress .  Her skin is warm and dry surgical site left hip--Steri-Strips are in place there is no sign of infection she is ambulating it does not appear with much difficulty is able to rise out of a chair with no problem   Eyes pupils appear equal round reactive to light sclera and conjunctiva are clear visual acuity is grossly intact.  Oropharynx is clear she has numerous extractions mucous membranes are moist.  Chest is clear to auscultation without any rhonchi rales or wheezes no  labored breathing.  Heart is regular rate and rhythm without murmur gallop or rub-she does not have significant lower extremity edema.  Abdomen is soft mildly obese and protuberant-bowel sounds are active-abdomen is nontender. .  Extremities-moves all extremities x4 -as -noted above --rises  out of chair and ambulates appears without much difficulty --- does use a cane .  Neurologic is grossly intact cranial nerves intact her speech is clear no lateralizing findings.  Psych she is alert and oriented x3 pleasant and appropriate laughing smiling in good --. Looking forward to going home  Labs November third 2014.  WBC 10.8 hemoglobin 9.0 granulocyte percent within normal limits at 69 after [place] within normal limits at 7.4.  Sodium 137 potassium 4.5 BUN 22 creatinine 1.03.  TSH at 6.081.  Vitamin D-39.    .  10/29/2013.  WBC 13.6 hemoglobin 9.4 platelets 233 granulocytes 9.1.  Sodium 134 potassium 4 BUN 20 creatinine 1.05  10/17/2013-.  Liver function tests within normal limits.  10/25/2013.  Hemoglobin A1c-9.0   Assessment and plan .  #1-status post left hip arthroplasty with history of osteoarthritis-this appears to be progressing well she is receiving Oxy as needed for painshe is onXeralto---will need continued  PT and OT -per orthopedic she is to remain on the Xeralto until seen by them.   #2 diabetes type 2-CBGs in the morning tend to run in the mid 100s generally later in the day also in the mid 100s occasional spikes but these are fairly rare-- later in the day likewise largely mid 100s with occasional spikes over 200 but this does not appear to be consistent--she will have followup by endocrinology--she continues on 70 units of Levemir in the morning and 80 units in the evening as well as sliding scale NovoLog .  #3-hypertension-appears this is been stable she is on lisinopril as well as verapamil--  Recent blood pressure is 115/48-107/51- .  #4-hypothyroidism she is on  Synthroid --TSH on November 3 was mildly elevated at 6.081--per recent lab on 11/01/2013-which I have just reviewed-will need to be faxed to primary care provider suspect her Synthroid will have to be mildly increased  #5-GERD this appears to be stable on Prilosec.  #6-hyponatremia-this appears to be stable per lab done on November 3 where sodium was 137  #7-history of depression-this appears stable on Celexa-it appears the hospital considered discontinuing Celexa secondary to hyponatremia but she continues on this and sodium appears to be stable  #8-fever of unknown origin-and appears white count is trending down -she has been treated for UTI     #9 vitamin D deficiency-he is on supplementation --vitamin D level was 39 on November 3  #10 -- constipation--this apparently has been stable on current medication 11 -- anemia-this is been fairly baseline during her stay here will need followup by PCP    [id number] note greater than 30 minutes spent preparing this discharge summary r     .

## 2013-11-22 ENCOUNTER — Ambulatory Visit (HOSPITAL_COMMUNITY)
Admit: 2013-11-22 | Discharge: 2013-11-22 | Disposition: A | Discharge status: Home / Self Care | Payer: Medicare Other | Source: Ambulatory Visit | Attending: Internal Medicine | Admitting: Internal Medicine

## 2013-11-22 DIAGNOSIS — IMO0001 Reserved for inherently not codable concepts without codable children: Secondary | ICD-10-CM | POA: Insufficient documentation

## 2013-11-22 DIAGNOSIS — R2689 Other abnormalities of gait and mobility: Secondary | ICD-10-CM | POA: Insufficient documentation

## 2013-11-22 DIAGNOSIS — I10 Essential (primary) hypertension: Secondary | ICD-10-CM | POA: Insufficient documentation

## 2013-11-22 DIAGNOSIS — E785 Hyperlipidemia, unspecified: Secondary | ICD-10-CM | POA: Insufficient documentation

## 2013-11-22 DIAGNOSIS — E119 Type 2 diabetes mellitus without complications: Secondary | ICD-10-CM | POA: Insufficient documentation

## 2013-11-22 DIAGNOSIS — R262 Difficulty in walking, not elsewhere classified: Secondary | ICD-10-CM | POA: Insufficient documentation

## 2013-11-22 DIAGNOSIS — M25559 Pain in unspecified hip: Secondary | ICD-10-CM | POA: Insufficient documentation

## 2013-11-22 DIAGNOSIS — Z794 Long term (current) use of insulin: Secondary | ICD-10-CM | POA: Insufficient documentation

## 2013-11-22 NOTE — Evaluation (Signed)
Physical Therapy Evaluation  Patient Details  Name: [name] MRN: [id number] Date of Birth: 06/15/1951  Today's Date: 11/22/2013 Time: 1103-1150 PT Time Calculation (min): 47 min Charge Eval             Visit#: 1 of 8  Re-eval: 12/22/13 Assessment Diagnosis: LT THR Surgical Date: 10/25/13 Next MD Visit: 12/15 Prior Therapy: SNF  Authorization: Medicare    Authorization Visit#: 1 of 8   Past Medical History:  Past Medical History  Diagnosis Date  . Diabetes mellitus   . Hypertension   . Acute diverticulosis     First episode 2011. second episode August 2014.  . Drug allergy     To augmentin  . Osteoarthritis   . GERD (gastroesophageal reflux disease)   . Hyperlipidemia   . Depression   . Hypothyroidism   . Internal hemorrhoids with other complication 07/07/2013  . PONV (postoperative nausea and vomiting)   . Urinary frequency    Past Surgical History:  Past Surgical History  Procedure Laterality Date  . Partial hysterectomy    . Bladder surgery      Tack  . Carpal tunnel release      Bilateral  . Arm surgery      Bilateral  . Lipoma excision      From abdomen  . Tonsillectomy    . Goiter removal    . Ileocolonoscopy  04/06/2008    NWG:NFAOZH terminal ileum, approximately 15-20 cm visualized/sigmoid colon diverticula/Normal retroflexed view of the rectum.  . Esophagogastroduodenoscopy  04/06/2008    YQM:VHQION erosions with occasional ulceration in the distal esophagus at the GE junction.  Patent fibrous ring/3-4 cm sliding hiatal hernia/Normal duodenal bulb ampulla in second portion of the duodenum  . Cataract extraction      bilateral  . Colonoscopy N/A 06/22/2013    GEX:BMWUXLKG HEMORRHOIDS/Mild diverticulosis  in the ascending colon &s moderate diverticulosis  in the descending colon and sigmoid colon/Two SMALL RECTAL polyps & The colon IS redundant. hyperplastic polyp. next TCS 05/2023.  Marland Kitchen Hemorrhoid banding N/A 06/22/2013    SLF: BANDS APPLIED  SUCCESSFULLY  . Ovarian cyst removal    . Dilation and curettage of uterus    . Joint replacement Right     rt. middlle finger oint  . Total hip arthroplasty Left 10/25/2013    Dr Cleophas Dunker  . Total hip arthroplasty Left 10/25/2013    Procedure: TOTAL HIP ARTHROPLASTY- left;  Surgeon: Valeria Batman, MD;  Location: [facility] OR;  Service: Orthopedics;  Laterality: Left;    Subjective Symptoms/Limitations Symptoms: [name] is a 62 yo female who opted to have a THR on 10/25/2013.  She was discharged on 10/28/2013 to the [facility] center where she stayed until 01/15/2013.  Pt states that since she has been home she has had the most difficulty has been trying to find a place to sit.  She states that she was getting manual at the [facility] due to her hip mm being very tight but she is unble to strech it due to her precautions.  Her friend states that occasionally she loses her balance. She is being referred to PT to maximize her functional ability. How long can you sit comfortably?: Able to sit for 30 minutes but has stiffened up by the end of the drive. How long can you stand comfortably?: Able to stand holding onto the kitchen sink for 40 minutes (with 4-5 short rest breaks), but she needed to take a mm relaxor and was exhausted  afterwards. How long can you walk comfortably?: Prior to her hip bothering her she used no assistive device.  She began using a cane in August.  She is now using a cane and is able to walk has been five-ten  minutes.   Pain Assessment Currently in Pain?: No/denies Pain Score:  (soreness 2; worst pain is a 6/10)) Pain Location: Hip Pain Orientation: Left Pain Onset: More than a month ago Pain Relieving Factors: mm relaxors  Precautions/Restrictions   posterior hip  Balance Screening Balance Screen Has the patient fallen in the past 6 months: No  Prior Functioning  Prior Function Vocation: Unemployed Leisure: Hobbies-yes (Comment) Comments: crochet, knit and  sew...     Sensation/Coordination/Flexibility/Functional Tests Functional Tests Functional Tests: foto functional score 45 risk adjusted 37 Functional Tests: sit to stand 30 sec 7 times  Assessment LLE Strength Left Hip Flexion: 5/5 Left Hip Extension: 3/5 Left Hip ABduction: 5/5 Left Knee Flexion: 5/5 Left Knee Extension: 5/5 Left Ankle Dorsiflexion: 4/5  Exercise/Treatments Mobility/Balance  Static Standing Balance Single Leg Stance - Right Leg: 3 Single Leg Stance - Left Leg:  (tries to lift but unable)    Manual Therapy Manual Therapy: Myofascial release Myofascial Release: to decrease tension of glut and IT band  Physical Therapy Assessment and Plan PT Assessment and Plan Clinical Impression Statement: Pt is a 62 yo female who had a Lt THR on 10/25/2013.  Limitations include decreased balance and hip extension strength.  PT will benefit from skilled PT to address these deficites and maximize pt functional ability. Pt will benefit from skilled therapeutic intervention in order to improve on the following deficits: Decreased activity tolerance;Decreased balance;Decreased strength;Difficulty walking;Increased fascial restricitons Rehab Potential: Good PT Frequency: Min 2X/week PT Duration: 4 weeks PT Treatment/Interventions: Gait training;Stair training;Therapeutic activities;Therapeutic exercise;Modalities;Manual techniques;Patient/family education PT Plan: Pt to be seen for higher balance activity; nustep and manual techniques to decrease IT band and gluts    Goals Home Exercise Program Pt/caregiver will Perform Home Exercise Program: For increased strengthening PT Short Term Goals Time to Complete Short Term Goals: 2 weeks PT Short Term Goal 1: Pt to be able to walk inside her house without the cane PT Short Term Goal 2: Pt to be able to sit for an hour without discomfort PT Short Term Goal 3: Pt pain level to be no greater than a 4/10 PT Long Term Goals Time to  Complete Long Term Goals: 4 weeks PT Long Term Goal 1: I in advance HEP PT Long Term Goal 2: PT to be able to walk inside and outside her house without an assistive device Long Term Goal 3: Pt to be able to SLS for 10 seconds to be able to walk without an assistive device Long Term Goal 4: Pain level no greater than a 2/10 to allow pt to no longer be taking mm relxors. PT Long Term Goal 5: Pt to be able to stand at sink for 45 minutes without a break and without being exhausted to allow pt to cook a meal or clean up after a meal without difficulty  Problem List Patient Active Problem List   Diagnosis Date Noted  . Poor balance 11/22/2013  . Difficulty in walking(719.7) 11/22/2013  . S/P hip replacement 11/15/2013  . Fever, unspecified 10/27/2013  . Type II or unspecified type diabetes mellitus without mention of complication, uncontrolled 10/25/2013  . S/P total hip arthroplasty 10/25/2013  . Hyponatremia 10/25/2013  . Diverticulitis 08/24/2013  . Nausea 08/24/2013  . Back  pain 08/24/2013  . Leukocytosis 08/24/2013  . Internal hemorrhoids with other complication 07/07/2013  . GERD (gastroesophageal reflux disease) 07/07/2013  . Rectal bleeding 06/09/2013  . URI, acute 04/17/2013  . Chronic insomnia 04/01/2013  . Osteopenia 12/30/2012  . Depression 09/28/2012  . Insulin dependent diabetes mellitus 09/28/2012  . Generalized OA 09/28/2012  . HYPOTHYROIDISM 08/22/2010  . HYPERLIPIDEMIA 08/22/2010  . HYPERTENSION,  08/22/2010  . DIVERTICULAR DISEASE 08/22/2010    General Behavior During Therapy: [facility] for tasks assessed/performed PT Plan of Care PT Home Exercise Plan: given   GP Functional Assessment Tool Used: foto Functional Limitation: Mobility: Walking and moving around Mobility: Walking and Moving Around Current Status (Z6109): At least 40 percent but less than 60 percent impaired, limited or restricted Mobility: Walking and Moving Around Goal Status [phone]): At least 20  percent but less than 40 percent impaired, limited or restricted  RUSSELL,[name] 11/22/2013, 12:05 PM  Physician Documentation Your signature is required to indicate approval of the treatment plan as stated above.  Please sign and either send electronically or make a copy of this report for your files and return this physician signed original.   Please mark one 1.__approve of plan  2. ___approve of plan with the following conditions.   ______________________________                                                          _____________________ Physician Signature                                                                                                             Date

## 2013-11-30 ENCOUNTER — Ambulatory Visit (HOSPITAL_COMMUNITY)
Admission: RE | Admit: 2013-11-30 | Discharge: 2013-11-30 | Disposition: A | Discharge status: Home / Self Care | Payer: Medicare Other | Source: Ambulatory Visit | Attending: Internal Medicine | Admitting: Internal Medicine

## 2013-11-30 DIAGNOSIS — M25559 Pain in unspecified hip: Secondary | ICD-10-CM | POA: Insufficient documentation

## 2013-11-30 DIAGNOSIS — E119 Type 2 diabetes mellitus without complications: Secondary | ICD-10-CM | POA: Insufficient documentation

## 2013-11-30 DIAGNOSIS — IMO0001 Reserved for inherently not codable concepts without codable children: Secondary | ICD-10-CM | POA: Insufficient documentation

## 2013-11-30 DIAGNOSIS — Z794 Long term (current) use of insulin: Secondary | ICD-10-CM | POA: Insufficient documentation

## 2013-11-30 DIAGNOSIS — E785 Hyperlipidemia, unspecified: Secondary | ICD-10-CM | POA: Insufficient documentation

## 2013-11-30 DIAGNOSIS — R262 Difficulty in walking, not elsewhere classified: Secondary | ICD-10-CM | POA: Insufficient documentation

## 2013-11-30 DIAGNOSIS — I10 Essential (primary) hypertension: Secondary | ICD-10-CM | POA: Insufficient documentation

## 2013-11-30 NOTE — Progress Notes (Signed)
Physical Therapy Treatment Patient Details  Name: [name] MRN: [id number] Date of Birth: [birth year]  Today's Date: 11/30/2013 Time: 0930-1000 PT Time Calculation (min): 30 min Visit#: 2 of 8  Re-eval: 12/22/13 Authorization: Medicare  Authorization Visit#: 2 of 8  Charges:  therex [phone] (14'), NMR [phone] (15')  Subjective: Symptoms/Limitations Symptoms: Pt states she had an allergic reaction to the inside stiches in her hip.  States her hip swelled, became red and warm and began running a temperature of 100 degrees.  Pt states MD put her on antibiotics. Pain Assessment Currently in Pain?: No/denies   Exercise/Treatments Balance Exercises Standing SLS: Eyes open;Solid surface;5 reps;Time SLS Time: Lt: 10 seconds max SLS with Vectors: 5 reps Balance Beam: 2RT Tandem Gait: 1 rep Retro Gait: 1 rep Sidestepping: 1 rep Heel Raises: 10 reps Toe Raise: 10 reps Other Standing Exercises: gait at fast pace 1RT, normal pace 1RT  Seated Other Seated Exercises: NuStep 8 minutes level surface, level 2    Physical Therapy Assessment and Plan PT Assessment and Plan Clinical Impression Statement: Pt late for appointment today due to confusion in appt. times.  Held manual to hip due to active infection.  Progressed dynamic balance activities.  pt without LOB during session.  Unable to SLS greater than 10 seconds on Lt LE; will need increased stability exercises for Lt hip mm.  Rehab Potential: Good PT Plan: Pt to be seen for higher balance activity.  Resume manual techniques to decrease IT band and gluts next week.     Problem List Patient Active Problem List   Diagnosis Date Noted  . Poor balance 11/22/2013  . Difficulty in walking(719.7) 11/22/2013  . S/P hip replacement 11/15/2013  . Fever, unspecified 10/27/2013  . Type II or unspecified type diabetes mellitus without mention of complication, uncontrolled 10/25/2013  . S/P total hip arthroplasty 10/25/2013  .  Hyponatremia 10/25/2013  . Diverticulitis 08/24/2013  . Nausea 08/24/2013  . Back pain 08/24/2013  . Leukocytosis 08/24/2013  . Internal hemorrhoids with other complication 07/07/2013  . GERD (gastroesophageal reflux disease) 07/07/2013  . Rectal bleeding 06/09/2013  . URI, acute 04/17/2013  . Chronic insomnia 04/01/2013  . Osteopenia 12/30/2012  . Depression 09/28/2012  . Insulin dependent diabetes mellitus 09/28/2012  . Generalized OA 09/28/2012  . HYPOTHYROIDISM 08/22/2010  . HYPERLIPIDEMIA 08/22/2010  . HYPERTENSION,  08/22/2010  . DIVERTICULAR DISEASE 08/22/2010    PT - End of Session Equipment Utilized During Treatment: Gait belt General Behavior During Therapy: [facility] for tasks assessed/performed   Lurena Nida, PTA/CLT 11/30/2013, 10:13 AM

## 2013-12-02 ENCOUNTER — Ambulatory Visit (HOSPITAL_COMMUNITY)
Admission: RE | Admit: 2013-12-02 | Discharge: 2013-12-02 | Disposition: A | Discharge status: Home / Self Care | Payer: Medicare Other | Source: Ambulatory Visit | Attending: Internal Medicine | Admitting: Internal Medicine

## 2013-12-02 NOTE — Progress Notes (Signed)
Physical Therapy Treatment Patient Details  Name: [name] MRN: [id number] Date of Birth: November 29, 1951  Today's Date: 12/02/2013 Time: 0852-0932 PT Time Calculation (min): 40 min  Visit#: 3 of 8  Re-eval: 12/22/13 Authorization: Medicare  Authorization Visit#: 3 of 8  Charges:  therex [phone] (14'), NMR [phone] (25')  Subjective: Pt states she is doing much better and her soreness is decreasing.  Currently without pain.  Exercise/Treatments Balance Exercises Standing SLS: Eyes open;Solid surface;5 reps;Time SLS Time: Lt: 10 seconds max SLS with Vectors: 5 reps Balance Beam: 2RT fwd/bkwd Tandem Gait: 1 rep Retro Gait: 1 rep Sidestepping: 1 rep;Theraband Theraband Level (Sidestepping): Level 4 (Blue) Step Over Hurdles / Cones: 2RT staggered Heel Raises: 15 reps Toe Raise: 15 reps Other Standing Exercises: gait at fast pace 1RT, normal pace 1RT Other Standing Exercises: gastroc stretch; long sitting hamstring stretch  Seated Other Seated Exercises: NuStep 8 minutes hills #2, level 3      Physical Therapy Assessment and Plan PT Assessment and Plan Clinical Impression Statement: Pt able to complete all balance activities without LOB.  Added hurdles with cues to step over, not around.  Increased difficulty of balance beam to forward and reverse.  Pt is progressing well with strength/balance.   PT Plan: Pt to be seen for higher balance activity. Add standing hip abduction/extension.  Pt to bring in 2inch heels to work on ambulation next visit.   Resume manual techniques to decrease IT band and gluts next week.     Problem List Patient Active Problem List   Diagnosis Date Noted  . Poor balance 11/22/2013  . Difficulty in walking(719.7) 11/22/2013  . S/P hip replacement 11/15/2013  . Fever, unspecified 10/27/2013  . Type II or unspecified type diabetes mellitus without mention of complication, uncontrolled 10/25/2013  . S/P total hip arthroplasty 10/25/2013  .  Hyponatremia 10/25/2013  . Diverticulitis 08/24/2013  . Nausea 08/24/2013  . Back pain 08/24/2013  . Leukocytosis 08/24/2013  . Internal hemorrhoids with other complication 07/07/2013  . GERD (gastroesophageal reflux disease) 07/07/2013  . Rectal bleeding 06/09/2013  . URI, acute 04/17/2013  . Chronic insomnia 04/01/2013  . Osteopenia 12/30/2012  . Depression 09/28/2012  . Insulin dependent diabetes mellitus 09/28/2012  . Generalized OA 09/28/2012  . HYPOTHYROIDISM 08/22/2010  . HYPERLIPIDEMIA 08/22/2010  . HYPERTENSION,  08/22/2010  . DIVERTICULAR DISEASE 08/22/2010    PT - End of Session Equipment Utilized During Treatment: Gait belt General Behavior During Therapy: [facility] for tasks assessed/performed  GP    Lurena Nida, PTA/CLT 12/02/2013, 9:45 AM

## 2013-12-05 ENCOUNTER — Ambulatory Visit (HOSPITAL_COMMUNITY)
Admission: RE | Admit: 2013-12-05 | Discharge: 2013-12-05 | Disposition: A | Discharge status: Home / Self Care | Payer: Medicare Other | Source: Ambulatory Visit | Attending: Internal Medicine | Admitting: Internal Medicine

## 2013-12-05 DIAGNOSIS — R262 Difficulty in walking, not elsewhere classified: Secondary | ICD-10-CM

## 2013-12-05 DIAGNOSIS — R2689 Other abnormalities of gait and mobility: Secondary | ICD-10-CM

## 2013-12-05 NOTE — Progress Notes (Addendum)
Physical Therapy Treatment Patient Details  Name: [name] MRN: [id number] Date of Birth: April 13, 1951  Today's Date: 12/05/2013 Time: 1110-1150 PT Time Calculation (min): 40 min Charge: gt 4098-1191; there ex 1125-1131; manual 1131-1152 Visit#: 4 of 8  Re-eval: 12/22/13    Authorization: Medicare  Authorization Time Period:    Authorization Visit#: 4 of 8   Subjective: Symptoms/Limitations Symptoms: Pt states her main complaint is that her hip is very tight.  Pt would also like to be able to walk safely in heals.  Pt brought in shoes with two inch heels today. Pain Assessment Pain Score: 0-No pain   Exercise/Treatments  Balance Exercises Standing Tandem Stance: Eyes open;2 reps;30 secs SLS: Eyes open;3 reps SLS Time: 10 seconds B on 2" heels Tandem Gait: 2 reps;Other reps (comment);Limitations Tandem Gait Limitations: on 2" heels Other Standing Exercises: steps on 2" heel x 2 flight. Other Standing Exercises: gt with 1" heels then gt with 2" heels      Manual Therapy Manual Therapy: Myofascial release Myofascial Release: to decrease tension in IT band.  Physical Therapy Assessment and Plan PT Assessment and Plan Clinical Impression Statement: Pt progressing welll.  Able to complete activities with two inch heels will continue to work balance with heels to achieve pt's goal.  Pt has significant fascial restriciton along IT band which was lessened but not obliterated with manual  PT Plan: treatment to focus on manual to decrease fascial restriction as well as high level balance activities in heels.    May add Ultrasound to restricted area on lateral thigh.   Problem List Patient Active Problem List   Diagnosis Date Noted  . Poor balance 11/22/2013  . Difficulty in walking(719.7) 11/22/2013  . S/P hip replacement 11/15/2013  . Fever, unspecified 10/27/2013  . Type II or unspecified type diabetes mellitus without mention of complication, uncontrolled 10/25/2013   . S/P total hip arthroplasty 10/25/2013  . Hyponatremia 10/25/2013  . Diverticulitis 08/24/2013  . Nausea 08/24/2013  . Back pain 08/24/2013  . Leukocytosis 08/24/2013  . Internal hemorrhoids with other complication 07/07/2013  . GERD (gastroesophageal reflux disease) 07/07/2013  . Rectal bleeding 06/09/2013  . URI, acute 04/17/2013  . Chronic insomnia 04/01/2013  . Osteopenia 12/30/2012  . Depression 09/28/2012  . Insulin dependent diabetes mellitus 09/28/2012  . Generalized OA 09/28/2012  . HYPOTHYROIDISM 08/22/2010  . HYPERLIPIDEMIA 08/22/2010  . HYPERTENSION,  08/22/2010  . DIVERTICULAR DISEASE 08/22/2010    PT - End of Session Equipment Utilized During Treatment: Gait belt  GP    [name],[name] 12/05/2013, 12:27 PM

## 2013-12-06 ENCOUNTER — Ambulatory Visit (HOSPITAL_COMMUNITY): Payer: Medicare Other | Admitting: Physical Therapy

## 2013-12-09 ENCOUNTER — Ambulatory Visit (HOSPITAL_COMMUNITY)
Admission: RE | Admit: 2013-12-09 | Discharge: 2013-12-09 | Disposition: A | Discharge status: Home / Self Care | Payer: Medicare Other | Source: Ambulatory Visit | Attending: Internal Medicine | Admitting: Internal Medicine

## 2013-12-09 DIAGNOSIS — R2689 Other abnormalities of gait and mobility: Secondary | ICD-10-CM

## 2013-12-09 DIAGNOSIS — R262 Difficulty in walking, not elsewhere classified: Secondary | ICD-10-CM

## 2013-12-09 NOTE — Progress Notes (Signed)
Physical Therapy Treatment Patient Details  Name: [name] MRN: [id number] Date of Birth: 01-17-1951  Today's Date: 12/09/2013 Time: 7829-5621 PT Time Calculation (min): 45 min Charge:  [place] [phone]; manual [phone]; gt activity [phone] Visit#: 5 of 8  Re-eval: 12/22/13    Authorization: Medicare   Subjective: Symptoms/Limitations Symptoms: Pt states she is only using her cane when she is out in public so she won't get bumped Pain Assessment Currently in Pain?: No/denies   Exercise/Treatments  Standing Tandem Stance: Eyes open SLS with Vectors: Solid surface;3 reps;10 secs Tandem Gait: 2 reps;Other reps (comment);Limitations Tandem Gait Limitations: on 2" heels turning head rt/lt and up/down Other Standing Exercises: steps on 2" heel x 2 flight.     Modalities Modalities: Ultrasound Manual Therapy Manual Therapy: Myofascial release Myofascial Release: MFR done to IT band to decrease adhesions.  Ultrasound Ultrasound Location: lateral aspect of thigh Ultrasound Parameters: 1.3 w/cm2 x 8' continuous   Physical Therapy Assessment and Plan PT Assessment and Plan Clinical Impression Statement: Pt had significant decrease in adhesions and tightness after manual and [place].  Pt with noted IR of Rt hip with ambulation.  Pt stated that she has always been severely pigeon toed but when she had the hip replacement they corrected her LT hip (probable catalyst for pt IT band being so tight) PT Plan: treatment to focus on manual to decrease fascial restriction as well as high level balance activities in heels.   Encourage pt to begin weaning from cane in public.  Goals  progressing  Problem List Patient Active Problem List   Diagnosis Date Noted  . Poor balance 11/22/2013  . Difficulty in walking(719.7) 11/22/2013  . S/P hip replacement 11/15/2013  . Fever, unspecified 10/27/2013  . Type II or unspecified type diabetes mellitus without mention of complication, uncontrolled  10/25/2013  . S/P total hip arthroplasty 10/25/2013  . Hyponatremia 10/25/2013  . Diverticulitis 08/24/2013  . Nausea 08/24/2013  . Back pain 08/24/2013  . Leukocytosis 08/24/2013  . Internal hemorrhoids with other complication 07/07/2013  . GERD (gastroesophageal reflux disease) 07/07/2013  . Rectal bleeding 06/09/2013  . URI, acute 04/17/2013  . Chronic insomnia 04/01/2013  . Osteopenia 12/30/2012  . Depression 09/28/2012  . Insulin dependent diabetes mellitus 09/28/2012  . Generalized OA 09/28/2012  . HYPOTHYROIDISM 08/22/2010  . HYPERLIPIDEMIA 08/22/2010  . HYPERTENSION,  08/22/2010  . DIVERTICULAR DISEASE 08/22/2010       GP    RUSSELL,[name] 12/09/2013, 1:16 PM

## 2013-12-12 ENCOUNTER — Ambulatory Visit (HOSPITAL_COMMUNITY)
Admission: RE | Admit: 2013-12-12 | Discharge: 2013-12-12 | Disposition: A | Discharge status: Home / Self Care | Payer: Medicare Other | Source: Ambulatory Visit | Attending: Internal Medicine | Admitting: Internal Medicine

## 2013-12-12 DIAGNOSIS — R2689 Other abnormalities of gait and mobility: Secondary | ICD-10-CM

## 2013-12-12 DIAGNOSIS — R262 Difficulty in walking, not elsewhere classified: Secondary | ICD-10-CM

## 2013-12-12 NOTE — Evaluation (Signed)
Physical Therapy Evaluation  Patient Details  Name: [name] MRN: [id number] Date of Birth: [birth year]  Today's Date: 12/12/2013 Time: 0855 (pt late for treatment)-0940 PT Time Calculation (min): 45 min Charge:  855-997 gt; 907-919 manual 919-927 [place]; 927-933 mm test              Visit#: 6 of 8  Re-eval:   Assessment Diagnosis: LT THR Surgical Date: 10/25/13 Next MD Visit: 12/15 Prior Therapy: SNF  Authorization: medicare    Authorization Visit#: 6 of 8   Past Medical History:  Past Medical History  Diagnosis Date  . Diabetes mellitus   . Hypertension   . Acute diverticulosis     First episode 2011. second episode August 2014.  . Drug allergy     To augmentin  . Osteoarthritis   . GERD (gastroesophageal reflux disease)   . Hyperlipidemia   . Depression   . Hypothyroidism   . Internal hemorrhoids with other complication 07/07/2013  . PONV (postoperative nausea and vomiting)   . Urinary frequency    Past Surgical History:  Past Surgical History  Procedure Laterality Date  . Partial hysterectomy    . Bladder surgery      Tack  . Carpal tunnel release      Bilateral  . Arm surgery      Bilateral  . Lipoma excision      From abdomen  . Tonsillectomy    . Goiter removal    . Ileocolonoscopy  04/06/2008    NWG:NFAOZH terminal ileum, approximately 15-20 cm visualized/sigmoid colon diverticula/Normal retroflexed view of the rectum.  . Esophagogastroduodenoscopy  04/06/2008    YQM:VHQION erosions with occasional ulceration in the distal esophagus at the GE junction.  Patent fibrous ring/3-4 cm sliding hiatal hernia/Normal duodenal bulb ampulla in second portion of the duodenum  . Cataract extraction      bilateral  . Colonoscopy N/A 06/22/2013    GEX:BMWUXLKG HEMORRHOIDS/Mild diverticulosis  in the ascending colon &s moderate diverticulosis  in the descending colon and sigmoid colon/Two SMALL RECTAL polyps & The colon IS redundant. hyperplastic polyp. next TCS  05/2023.  Marland Kitchen Hemorrhoid banding N/A 06/22/2013    SLF: BANDS APPLIED SUCCESSFULLY  . Ovarian cyst removal    . Dilation and curettage of uterus    . Joint replacement Right     rt. middlle finger oint  . Total hip arthroplasty Left 10/25/2013    Dr Cleophas Dunker  . Total hip arthroplasty Left 10/25/2013    Procedure: TOTAL HIP ARTHROPLASTY- left;  Surgeon: Valeria Batman, MD;  Location: [facility] OR;  Service: Orthopedics;  Laterality: Left;    Subjective Symptoms/Limitations Symptoms: Pt is using no assistive device about 30% now.  States Ultrasound helped tightness How long can you sit comfortably?: Able to sit as long as she want now was 30 minutes. How long can you stand comfortably?: Pt is able to stand 40 minutes at the sink with no rest breaks.  (was taking 4-5 rest breaks) How long can you walk comfortably?: Walking with cane 15 minutes was 5-10. Pain Assessment Currently in Pain?: No/denies    Prior Functioning  Prior Function Vocation: Unemployed Leisure: Hobbies-yes (Comment) Comments: crochet, knit and sew...     Sensation/Coordination/Flexibility/Functional Tests Functional Tests Functional Tests: foto functional score 45 risk adjusted 37 Functional Tests: sit to stand 30 sec 10  times was 7  Assessment LLE Strength Left Hip Flexion: 5/5 Left Hip Extension: 5/5 Left Hip ABduction: 5/5 (was 3/5 ) Left Knee Flexion:  5/5 Left Knee Extension: 5/5 Left Ankle Dorsiflexion: 5/5 (was 4/5)  Exercise/Treatments Mobility/Balance  Static Standing Balance Single Leg Stance - Right Leg: 10 (was 3) Single Leg Stance - Left Leg: 10 (tries to lift but unable)   Balance Exercises Standing Tandem Stance: Eyes open;Eyes closed (on 3" heels) SLS with Vectors: Solid surface;3 reps;10 secs Standing, One Foot on a Step: Eyes closed;6 inch;Limitations Standing, One Foot on a Step Limitations: on 3" heels Tandem Gait: 2 reps;Other reps (comment);Limitations Tandem Gait Limitations:  on 3" heels turning head rt/lt and up/down Other Standing Exercises: steps on 3"  heel x 2 flight.     Modalities Modalities: Ultrasound Manual Therapy Manual Therapy: Myofascial release Myofascial Release: MFR done to IT band to decrease adhesions Ultrasound Ultrasound Location: Ultrasound done to lateral aspect of thig  Ultrasound Parameters: 1.3 w/cm2 x 9'  Physical Therapy Assessment and Plan PT Assessment and Plan Clinical Impression Statement: Pt has decreased adhesions and tightness in IT band.  Pt is painfree and progressing with all goals. Anticipate discharge on Monday 12/19/2013.  Pt strength is normal limitation is high balance and fascial restrictions of IT band. PT Plan: Pt has progressed well discharge in two treatments with pt completing foto on 12/22    Goals Home Exercise Program PT Goal: Perform Home Exercise Program - Progress: Met PT Short Term Goals PT Short Term Goal 1: Pt to be able to walk inside her house without the cane PT Short Term Goal 1 - Progress: Progressing toward goal PT Short Term Goal 2: Pt to be able to sit for an hour without discomfort PT Short Term Goal 2 - Progress: Met PT Short Term Goal 3: Pt pain level to be no greater than a 4/10 PT Short Term Goal 3 - Progress: Met PT Long Term Goals PT Long Term Goal 1: I in advance HEP PT Long Term Goal 1 - Progress: Met PT Long Term Goal 2: PT to be able to walk inside and outside her house without an assistive device PT Long Term Goal 2 - Progress: Progressing toward goal Long Term Goal 3: Pt to be able to SLS for 10 seconds to be able to walk without an assistive device Long Term Goal 3 Progress: Met  Problem List Patient Active Problem List   Diagnosis Date Noted  . Poor balance 11/22/2013  . Difficulty in walking(719.7) 11/22/2013  . S/P hip replacement 11/15/2013  . Fever, unspecified 10/27/2013  . Type II or unspecified type diabetes mellitus without mention of complication,  uncontrolled 10/25/2013  . S/P total hip arthroplasty 10/25/2013  . Hyponatremia 10/25/2013  . Diverticulitis 08/24/2013  . Nausea 08/24/2013  . Back pain 08/24/2013  . Leukocytosis 08/24/2013  . Internal hemorrhoids with other complication 07/07/2013  . GERD (gastroesophageal reflux disease) 07/07/2013  . Rectal bleeding 06/09/2013  . URI, acute 04/17/2013  . Chronic insomnia 04/01/2013  . Osteopenia 12/30/2012  . Depression 09/28/2012  . Insulin dependent diabetes mellitus 09/28/2012  . Generalized OA 09/28/2012  . HYPOTHYROIDISM 08/22/2010  . HYPERLIPIDEMIA 08/22/2010  . HYPERTENSION,  08/22/2010  . DIVERTICULAR DISEASE 08/22/2010       GP    RUSSELL,[name] 12/12/2013, 11:18 AM  Physician Documentation Your signature is required to indicate approval of the treatment plan as stated above.  Please sign and either send electronically or make a copy of this report for your files and return this physician signed original.   Please mark one 1.__approve of plan  2. ___approve of plan  with the following conditions.   ______________________________                                                          _____________________ Physician Signature                                                                                                             Date

## 2013-12-14 ENCOUNTER — Ambulatory Visit (HOSPITAL_COMMUNITY): Payer: Medicare Other | Admitting: *Deleted

## 2013-12-19 ENCOUNTER — Ambulatory Visit (HOSPITAL_COMMUNITY)
Admission: RE | Admit: 2013-12-19 | Discharge: 2013-12-19 | Disposition: A | Discharge status: Home / Self Care | Payer: Medicare Other | Source: Ambulatory Visit | Attending: Internal Medicine | Admitting: Internal Medicine

## 2013-12-19 NOTE — Evaluation (Signed)
Physical Therapy Evaluation  Patient Details  Name: [name] MRN: [id number] Date of Birth: Jun 02, 1951  Today's Date: 12/19/2013 Time: 6295-2841 PT Time Calculation (min): 25 min Charges: Self care x 23'              Visit#: 7 of 8  Assessment Diagnosis: LT THR Next MD Visit: 12/28/13 Prior Therapy: SNF  Authorization: medicare    Authorization Visit#: 7 of 8   Past Medical History:  Past Medical History  Diagnosis Date  . Diabetes mellitus   . Hypertension   . Acute diverticulosis     First episode 2011. second episode August 2014.  . Drug allergy     To augmentin  . Osteoarthritis   . GERD (gastroesophageal reflux disease)   . Hyperlipidemia   . Depression   . Hypothyroidism   . Internal hemorrhoids with other complication 07/07/2013  . PONV (postoperative nausea and vomiting)   . Urinary frequency    Past Surgical History:  Past Surgical History  Procedure Laterality Date  . Partial hysterectomy    . Bladder surgery      Tack  . Carpal tunnel release      Bilateral  . Arm surgery      Bilateral  . Lipoma excision      From abdomen  . Tonsillectomy    . Goiter removal    . Ileocolonoscopy  04/06/2008    LKG:MWNUUV terminal ileum, approximately 15-20 cm visualized/sigmoid colon diverticula/Normal retroflexed view of the rectum.  . Esophagogastroduodenoscopy  04/06/2008    OZD:GUYQIH erosions with occasional ulceration in the distal esophagus at the GE junction.  Patent fibrous ring/3-4 cm sliding hiatal hernia/Normal duodenal bulb ampulla in second portion of the duodenum  . Cataract extraction      bilateral  . Colonoscopy N/A 06/22/2013    KVQ:QVZDGLOV HEMORRHOIDS/Mild diverticulosis  in the ascending colon &s moderate diverticulosis  in the descending colon and sigmoid colon/Two SMALL RECTAL polyps & The colon IS redundant. hyperplastic polyp. next TCS 05/2023.  Marland Kitchen Hemorrhoid banding N/A 06/22/2013    SLF: BANDS APPLIED SUCCESSFULLY  . Ovarian  cyst removal    . Dilation and curettage of uterus    . Joint replacement Right     rt. middlle finger oint  . Total hip arthroplasty Left 10/25/2013    Dr Cleophas Dunker  . Total hip arthroplasty Left 10/25/2013    Procedure: TOTAL HIP ARTHROPLASTY- left;  Surgeon: Valeria Batman, MD;  Location: [facility] OR;  Service: Orthopedics;  Laterality: Left;    Subjective Symptoms/Limitations Symptoms: Pt states that she feels she is ready to be discharged from therapy. Pain Assessment Currently in Pain?: No/denies   Sensation/Coordination/Flexibility/Functional Tests Functional Tests Functional Tests: foto functional score 75 (was 45 (12/12/13)) Functional Tests: sit to stand 30 sec 10  times was 7  Assessment LLE Strength remains 5/5 throughout  Exercise/Treatments Mobility/Balance  Static Standing Balance Single Leg Stance - Right Leg: 10 (was 10 (12/12/13)) Single Leg Stance - Left Leg: 18 (was 18 (12/12/13))   Physical Therapy Assessment and Plan PT Assessment and Plan Clinical Impression Statement: Pt has progressed well with therapy. Pt displays significant improvements in strength and stability. She is no longer limited by hip pain or weakness. All goals have been met. Pt is independent with HEP. PT Plan: Recommend D/C to HEP.    Goals PT Short Term Goals PT Short Term Goal 1: Pt to be able to walk inside her house without the cane PT Short Term  Goal 1 - Progress: Met PT Short Term Goal 2: Pt to be able to sit for an hour without discomfort PT Short Term Goal 2 - Progress: Met PT Short Term Goal 3: Pt pain level to be no greater than a 4/10 PT Short Term Goal 3 - Progress: Met PT Long Term Goals PT Long Term Goal 1: I in advance HEP PT Long Term Goal 1 - Progress: Met PT Long Term Goal 2: PT to be able to walk inside and outside her house without an assistive device PT Long Term Goal 2 - Progress: Met Long Term Goal 3: Pt to be able to SLS for 10 seconds to be able to walk  without an assistive device Long Term Goal 3 Progress: Met  Problem List Patient Active Problem List   Diagnosis Date Noted  . Poor balance 11/22/2013  . Difficulty in walking(719.7) 11/22/2013  . S/P hip replacement 11/15/2013  . Fever, unspecified 10/27/2013  . Type II or unspecified type diabetes mellitus without mention of complication, uncontrolled 10/25/2013  . S/P total hip arthroplasty 10/25/2013  . Hyponatremia 10/25/2013  . Diverticulitis 08/24/2013  . Nausea 08/24/2013  . Back pain 08/24/2013  . Leukocytosis 08/24/2013  . Internal hemorrhoids with other complication 07/07/2013  . GERD (gastroesophageal reflux disease) 07/07/2013  . Rectal bleeding 06/09/2013  . URI, acute 04/17/2013  . Chronic insomnia 04/01/2013  . Osteopenia 12/30/2012  . Depression 09/28/2012  . Insulin dependent diabetes mellitus 09/28/2012  . Generalized OA 09/28/2012  . HYPOTHYROIDISM 08/22/2010  . HYPERLIPIDEMIA 08/22/2010  . HYPERTENSION,  08/22/2010  . DIVERTICULAR DISEASE 08/22/2010    PT - End of Session Activity Tolerance: Patient tolerated treatment well General Behavior During Therapy: WFL for tasks assessed/performed  GP Functional Assessment Tool Used: foto Functional Limitation: Mobility: Walking and moving around Mobility: Walking and Moving Around Goal Status [phone]): At least 20 percent but less than 40 percent impaired, limited or restricted Mobility: Walking and Moving Around Discharge Status [phone]): At least 20 percent but less than 40 percent impaired, limited or restricted  Seth Bake, PTA 12/19/2013, 9:22 AM  Physician Documentation Your signature is required to indicate approval of the treatment plan as stated above.  Please sign and either send electronically or make a copy of this report for your files and return this physician signed original.   Please mark one 1.__approve of plan  2. ___approve of plan with the following  conditions.   ______________________________                                                          _____________________ Physician Signature                                                                                                             Date

## 2013-12-21 ENCOUNTER — Ambulatory Visit (HOSPITAL_COMMUNITY): Payer: Medicare Other | Admitting: Physical Therapy

## 2013-12-30 NOTE — Progress Notes (Signed)
REVIEWED.  TCS/HEMORRHOID BANDING JUN 2014 HYPERPLASTIC POLYPS

## 2014-11-09 IMAGING — CR DG HIP 1V PORT*L*
1 series · 1 of 1 positions shown · non-contrast
Comparison: Pelvis imaged performed today.

CLINICAL DATA: Postop total left hip replacement.

EXAM:
PORTABLE LEFT HIP - 1 VIEW

[xtable lateral]
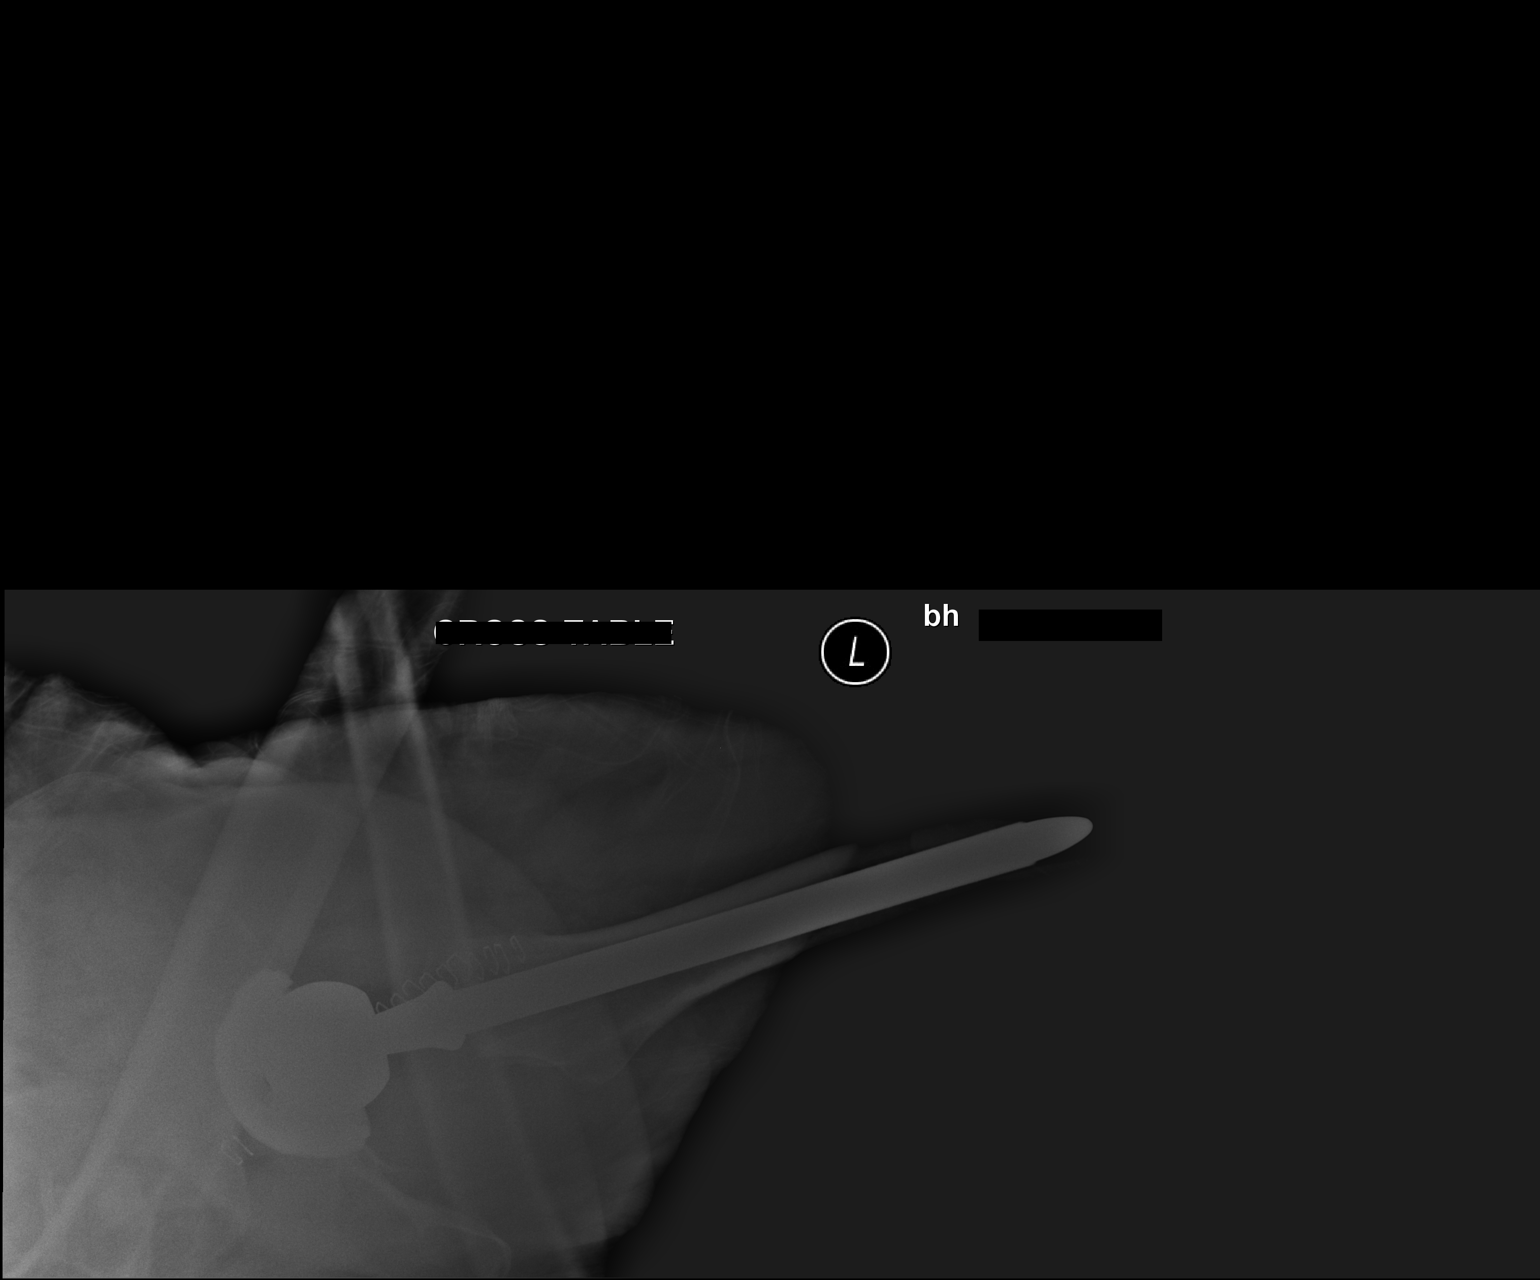

[1 of 1 positions shown; findings below may reference images not displayed]

FINDINGS: Cross-table lateral view demonstrates changes of left hip
replacement. Normal alignment. No visible bony complicating feature.
IMPRESSION: Left hip replacement. No visible complicating feature.

## 2023-05-12 ENCOUNTER — Encounter: Payer: Self-pay | Admitting: *Deleted
